# Patient Record
Sex: Female | Born: 1945
Health system: Southern US, Community
[De-identification: ages and names within clinical notes are randomized; demographics above are authoritative.]

## PROBLEM LIST (undated history)

## (undated) DIAGNOSIS — Z862 Personal history of diseases of the blood and blood-forming organs and certain disorders involving the immune mechanism: Secondary | ICD-10-CM

## (undated) DIAGNOSIS — K219 Gastro-esophageal reflux disease without esophagitis: Secondary | ICD-10-CM

## (undated) DIAGNOSIS — E119 Type 2 diabetes mellitus without complications: Secondary | ICD-10-CM

## (undated) DIAGNOSIS — Z853 Personal history of malignant neoplasm of breast: Secondary | ICD-10-CM

## (undated) DIAGNOSIS — I2510 Atherosclerotic heart disease of native coronary artery without angina pectoris: Secondary | ICD-10-CM

## (undated) DIAGNOSIS — I10 Essential (primary) hypertension: Secondary | ICD-10-CM

## (undated) DIAGNOSIS — R51 Headache: Secondary | ICD-10-CM

## (undated) DIAGNOSIS — M1990 Unspecified osteoarthritis, unspecified site: Secondary | ICD-10-CM

## (undated) HISTORY — DX: Type 2 diabetes mellitus without complications: E11.9

## (undated) HISTORY — DX: Atherosclerotic heart disease of native coronary artery without angina pectoris: I25.10

## (undated) HISTORY — PX: MASTECTOMY: SHX3

## (undated) HISTORY — DX: Unspecified osteoarthritis, unspecified site: M19.90

## (undated) HISTORY — DX: Personal history of malignant neoplasm of breast: Z85.3

## (undated) HISTORY — PX: CHOLECYSTECTOMY: SHX55

## (undated) HISTORY — PX: JOINT REPLACEMENT: SHX530

## (undated) HISTORY — PX: TONSILLECTOMY: SUR1361

## (undated) HISTORY — PX: ABDOMINAL HYSTERECTOMY: SHX81

---

## 1998-01-31 ENCOUNTER — Ambulatory Visit (HOSPITAL_BASED_OUTPATIENT_CLINIC_OR_DEPARTMENT_OTHER): Admission: RE | Admit: 1998-01-31 | Discharge: 1998-01-31 | Payer: Self-pay | Admitting: Surgery

## 1998-02-11 ENCOUNTER — Inpatient Hospital Stay (HOSPITAL_COMMUNITY): Admission: RE | Admit: 1998-02-11 | Discharge: 1998-02-12 | Payer: Self-pay | Admitting: Surgery

## 1998-03-11 ENCOUNTER — Encounter: Admission: RE | Admit: 1998-03-11 | Discharge: 1998-06-09 | Payer: Self-pay | Admitting: Surgery

## 1998-03-20 ENCOUNTER — Ambulatory Visit (HOSPITAL_COMMUNITY): Admission: RE | Admit: 1998-03-20 | Discharge: 1998-03-20 | Payer: Self-pay | Admitting: Emergency Medicine

## 1998-09-08 ENCOUNTER — Ambulatory Visit (HOSPITAL_COMMUNITY): Admission: RE | Admit: 1998-09-08 | Discharge: 1998-09-08 | Payer: Self-pay | Admitting: Emergency Medicine

## 2000-08-23 ENCOUNTER — Ambulatory Visit (HOSPITAL_COMMUNITY): Admission: RE | Admit: 2000-08-23 | Discharge: 2000-08-23 | Payer: Self-pay | Admitting: Gastroenterology

## 2000-08-23 ENCOUNTER — Encounter (INDEPENDENT_AMBULATORY_CARE_PROVIDER_SITE_OTHER): Payer: Self-pay | Admitting: *Deleted

## 2001-03-20 ENCOUNTER — Ambulatory Visit (HOSPITAL_COMMUNITY): Admission: RE | Admit: 2001-03-20 | Discharge: 2001-03-20 | Payer: Self-pay | Admitting: Family Medicine

## 2001-07-23 ENCOUNTER — Ambulatory Visit (HOSPITAL_COMMUNITY): Admission: RE | Admit: 2001-07-23 | Discharge: 2001-07-23 | Payer: Self-pay | Admitting: Emergency Medicine

## 2001-07-26 ENCOUNTER — Ambulatory Visit (HOSPITAL_COMMUNITY): Admission: RE | Admit: 2001-07-26 | Discharge: 2001-07-26 | Payer: Self-pay | Admitting: Emergency Medicine

## 2001-07-26 ENCOUNTER — Encounter: Payer: Self-pay | Admitting: Emergency Medicine

## 2002-01-01 ENCOUNTER — Other Ambulatory Visit: Admission: RE | Admit: 2002-01-01 | Discharge: 2002-01-01 | Payer: Self-pay | Admitting: Dermatology

## 2002-04-16 ENCOUNTER — Encounter: Admission: RE | Admit: 2002-04-16 | Discharge: 2002-07-15 | Payer: Self-pay | Admitting: Family Medicine

## 2002-05-21 ENCOUNTER — Encounter: Admission: RE | Admit: 2002-05-21 | Discharge: 2002-05-21 | Payer: Self-pay | Admitting: Oncology

## 2002-05-21 ENCOUNTER — Encounter (HOSPITAL_COMMUNITY): Admission: RE | Admit: 2002-05-21 | Discharge: 2002-06-20 | Payer: Self-pay | Admitting: Oncology

## 2002-05-22 ENCOUNTER — Encounter (HOSPITAL_COMMUNITY): Payer: Self-pay | Admitting: Oncology

## 2002-05-22 ENCOUNTER — Ambulatory Visit (HOSPITAL_COMMUNITY): Admission: RE | Admit: 2002-05-22 | Discharge: 2002-05-22 | Payer: Self-pay | Admitting: Oncology

## 2002-06-26 ENCOUNTER — Ambulatory Visit (HOSPITAL_COMMUNITY): Admission: RE | Admit: 2002-06-26 | Discharge: 2002-06-26 | Payer: Self-pay | Admitting: Internal Medicine

## 2003-01-14 ENCOUNTER — Encounter: Admission: RE | Admit: 2003-01-14 | Discharge: 2003-01-14 | Payer: Self-pay | Admitting: Oncology

## 2003-02-14 ENCOUNTER — Ambulatory Visit (HOSPITAL_COMMUNITY): Admission: RE | Admit: 2003-02-14 | Discharge: 2003-02-14 | Payer: Self-pay | Admitting: Pulmonary Disease

## 2003-12-03 ENCOUNTER — Ambulatory Visit (HOSPITAL_COMMUNITY): Admission: RE | Admit: 2003-12-03 | Discharge: 2003-12-03 | Payer: Self-pay | Admitting: Orthopaedic Surgery

## 2004-03-19 ENCOUNTER — Ambulatory Visit (HOSPITAL_COMMUNITY): Admission: RE | Admit: 2004-03-19 | Discharge: 2004-03-19 | Payer: Self-pay | Admitting: Obstetrics and Gynecology

## 2004-11-29 ENCOUNTER — Ambulatory Visit (HOSPITAL_COMMUNITY): Admission: RE | Admit: 2004-11-29 | Discharge: 2004-11-29 | Payer: Self-pay | Admitting: Emergency Medicine

## 2005-04-22 ENCOUNTER — Ambulatory Visit (HOSPITAL_COMMUNITY): Admission: RE | Admit: 2005-04-22 | Discharge: 2005-04-22 | Payer: Self-pay | Admitting: Orthopedic Surgery

## 2005-08-09 ENCOUNTER — Inpatient Hospital Stay (HOSPITAL_COMMUNITY): Admission: RE | Admit: 2005-08-09 | Discharge: 2005-08-12 | Payer: Self-pay | Admitting: Orthopedic Surgery

## 2005-08-13 ENCOUNTER — Encounter (HOSPITAL_COMMUNITY): Admission: RE | Admit: 2005-08-13 | Discharge: 2005-09-12 | Payer: Self-pay | Admitting: Oncology

## 2005-08-13 ENCOUNTER — Encounter: Admission: RE | Admit: 2005-08-13 | Discharge: 2005-08-13 | Payer: Self-pay | Admitting: Oncology

## 2005-08-13 ENCOUNTER — Ambulatory Visit (HOSPITAL_COMMUNITY): Payer: Self-pay | Admitting: Oncology

## 2005-08-31 ENCOUNTER — Encounter (HOSPITAL_COMMUNITY): Admission: RE | Admit: 2005-08-31 | Discharge: 2005-09-22 | Payer: Self-pay | Admitting: Orthopedic Surgery

## 2005-09-29 ENCOUNTER — Encounter (HOSPITAL_COMMUNITY): Admission: RE | Admit: 2005-09-29 | Discharge: 2005-10-13 | Payer: Self-pay | Admitting: Orthopedic Surgery

## 2005-11-04 ENCOUNTER — Ambulatory Visit (HOSPITAL_COMMUNITY): Admission: RE | Admit: 2005-11-04 | Discharge: 2005-11-04 | Payer: Self-pay | Admitting: Emergency Medicine

## 2005-12-20 ENCOUNTER — Other Ambulatory Visit: Admission: RE | Admit: 2005-12-20 | Discharge: 2005-12-20 | Payer: Self-pay | Admitting: General Surgery

## 2005-12-20 ENCOUNTER — Encounter (INDEPENDENT_AMBULATORY_CARE_PROVIDER_SITE_OTHER): Payer: Self-pay | Admitting: *Deleted

## 2006-02-10 ENCOUNTER — Ambulatory Visit (HOSPITAL_COMMUNITY): Admission: RE | Admit: 2006-02-10 | Discharge: 2006-02-10 | Payer: Self-pay | Admitting: Orthopedic Surgery

## 2009-05-16 ENCOUNTER — Ambulatory Visit (HOSPITAL_COMMUNITY): Admission: RE | Admit: 2009-05-16 | Discharge: 2009-05-16 | Payer: Self-pay | Admitting: Family Medicine

## 2011-02-12 NOTE — Op Note (Signed)
NAME:  Brenda Yoder                          ACCOUNT NO.:  [id number]   MEDICAL RECORD NO.:  [id number]                   PATIENT TYPE:  AMB   LOCATION:  DAY                                  FACILITY:  [facility]   PHYSICIAN:  Tilda Burrow, M.D.              DATE OF BIRTH:  [birth year]   DATE OF PROCEDURE:  DATE OF DISCHARGE:                                 OPERATIVE REPORT   PREOPERATIVE DIAGNOSIS:  Left ovarian cyst, history of breast cancer, for  prophylactic removal of ovaries for ovarian cancer risk reduction.   POSTOPERATIVE DIAGNOSES:  1. Left ovarian cyst, history of breast cancer, for prophylactic removal of     ovaries for ovarian cancer risk reduction.  2. Right ovarian enlargement.  3. Umbilical hernia.   PROCEDURE:  1. Laparoscopic bilateral salpingo-oophorectomy.  2. Umbilical herniorrhaphy.   SURGEON:  Tilda Burrow, M.D.   ASSISTANT:  __________   ANESTHESIA:  General   COMPLICATIONS:  None.   FINDINGS:  Incarcerated omental fat and umbilical hernia, enlarged right  ovary with firm ovarian tissue without cystic component, adherent left ovary  easily removed.   DETAILS OF PROCEDURE:  The patient was taken to the operating room and  prepped and draped for combined abdominal and vaginal procedure.  A Foley  catheter was in place and sponge stick placed per vagina after abdominal and  vaginal prepping.  We then proceeded with Veress needle placement through  the umbilicus with orientation of the needle towards the pelvis with  pneumoperitoneum easily achieved at 3 liters CO2 under 11 mmHg pressure.  The laparoscopic trocar was introduced under direct visualization and we  identified that we were in omental tissue, but could see an easy window to  allow exposure and visibility of the abdomen.   I then placed a suprapubic 11-mm trocar under direct visualization and  inspected the pelvis with the patient in Trendelenburg position.  The camera  was rotated to  the suprapubic site and we visualized that she had omental  attachments in the umbilical hernia and we had simply gone through some of  the omental tissue.   I then proceed with identification of pelvic structures starting with the  left side where the very small left ovary was attached to the upper aspects  of the right side of the vaginal cuff.  The tube was small and we were able  to cut free the tube on that side.  We used the Gyrus bipolar cautery device  when adequate visualization allowed [place] too, we specifically removed the  attachments of the ovary to the left side of the vaginal cuff and left side  wall by sharp dissection, which allowed [place] to mobilize the ovary  sufficiently.  Then that transection across the infundibulopelvic ligament  could be easily done.  We were well out of the way of the ureter.  The rest  of the attachments  to the ovary to the side wall was sharply dissected off  with laparoscopic  Metzenbaum's.  Specimen was placed in the cul-de-sac.   We then moved to the right side where a much larger, and mobile ovary was  found.  The ureter could be visualized bilaterally to the surgical field.  The Gyrus bipolar cautery and cutting technique was used to cross-clamp  across the infundibulopelvic ligament and then peel the ovary off of the  side wall.  The result was very hemostatic and the specimens were then  placed in an EndoCatch bag through the suprapubic site.  NOTE:  During the  course of the procedure it became necessary to have additional  manipulating/grasping technique; and so a third port, 5-mm in diameter, was  placed under direct visualization in the right lower quadrant.   At this point we addressed the umbilical hernia.  The umbilical port was  taken out and digital pressure placed down on the umbilical hernia and we  were able to invert the sac and peel the omental fat off of its  infraumbilical attachments.   It was then apparent that we needed to  remove the specimen and the large 4-  cm ovary was removed through the umbilical site by expanding the fascial  incision transversely with sharp dissection and easily pulling the trocar  and bag through the umbilical site.  By this time we had already irrigated  the abdominal cavity and deflated the abdomen.  We removed the remaining  laparoscopic equipment.   Once the bag was removed, we closed the umbilical site by a series of  interrupted sutures of #0 Dexon closing the fascia which could be easily  visualized.  We then closed the fascia in the suprapubic site and then  closed all 3 skin sites.  The subcuticular sutures were placed followed by  staple closure of the skin. The patient tolerated the procedure well.      ___________________________________________                                            Tilda Burrow, M.D.   JVF/MEDQ  D:  03/20/2004  T:  03/20/2004  Job:  [id number]

## 2011-02-12 NOTE — H&P (Signed)
NAME:  Brenda Yoder, Brenda Yoder                          ACCOUNT NO.:  [id number]   MEDICAL RECORD NO.:  [id number]                   PATIENT TYPE:  AMB   LOCATION:  DAY                                  FACILITY:  [facility]   PHYSICIAN:  Tilda Burrow, M.D.              DATE OF BIRTH:  1946-04-28   DATE OF ADMISSION:  03/18/2004  DATE OF DISCHARGE:                                HISTORY & PHYSICAL   ADMISSION DIAGNOSES:  1. Left ovarian cyst.  2. History of breast cancer with prophylactic removal of ovaries for ovarian     cancer risk reduction.   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  She is well aware of the associated increased risks of ovarian  cancer and familial breast cancer, and she has talked with GYN oncologist,  Dr. Gildardo Griffes in the past and has come to the decision of prophylactic  bilateral salpingo-oophorectomy.  Hysterectomy has been performed in the  past.  She has investigated this process extensively, received reference  articles including contemporary OB-GYN reference articles and has decided to  proceed with laparoscopic bilateral salpingo-oophorectomy.  Brenda Yoder has  increased since pelvic ultrasound December 11, 2003, which was done for  prophylactic monitoring of ovaries and revealed a relatively obscured left  ovary but a large 3.8 cm right ovary. CA125  was quite reassuring 5.2 on a  scale of 0 to 30.  She desires to proceed.  She has had a bowel prep,  counseling including the risk of injury to adjacent organs and the potential  need for laparotomy to complete the procedure.  Plans are to proceed with  prophylactic bilateral salpingo-oophorectomy on March 19, 2004.   PAST MEDICAL HISTORY:  1. Hypertension.  2. Breast cancer treated, estrogen receptor positive, treated with  tamoxifen     for five years.  3. Status post hysterectomy.   PAST SURGICAL HISTORY:  1. Hysterectomy in 1982.  2. Cholecystectomy in 1994.  3. Bilateral mastectomy in 1999.  4. Knee surgery in 1984.   ALLERGIES:  None known.   MEDICATIONS:  Celebrex, Zestoretic, Zovirax, Imitrex, Ambien.   PHYSICAL EXAMINATION:  VITAL SIGNS: Height 5 feet 3 inches, weight 198.  GENERAL:  She is a healthy-appearing Caucasian female, alert and oriented x  3.  HEENT:  Pupils equal and reactive.  Extraocular movements intact.  NECK:  Supple. Trachea midline.  CARDIOVASCULAR:  Exam unremarkable.  ABDOMEN:  Moderate abdominal thickness.  CHEST:  Normal for status post prophylactic mastectomy.  PELVIC: External genitalia normal.  Cervix and uterus absent.  Adnexa  nontender with a  3.1 x 4.0 cm right ovary and smaller left ovary.   PLAN:  Laparoscopic bilateral salpingo-oophorectomy March 19, 2004.  ___________________________________________                                         Tilda Burrow, M.D.   JVF/MEDQ  D:  03/18/2004  T:  03/18/2004  Job:  [id number]

## 2011-02-12 NOTE — Procedures (Signed)
[place]  Patient:    Brenda Yoder, Brenda Yoder                       MRN: [id number] Proc. Date: 08/23/00 Adm. Date:  [id number] Attending:  Rich Brave CC:         Talmadge Coventry, M.D.   Procedure Report  PROCEDURE:  Colonoscopy with hot biopsy and polypectomy.  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  FINDINGS:  Large sigmoid polyp removed.  Several tiny rectosigmoid polyps removed.  DESCRIPTION OF PROCEDURE:  The nature, purpose, and risks of the procedure were familiar to the patient, who provided written consent.  Sedation prior to and during the course of the procedure totalled fentanyl 70 mcg and Versed 8 mg IV without arrhythmias or desaturation.  The Olympus pediatric adjustable-tension video colonoscope was advanced through a slightly angulated sigmoid region and then quite easily to the cecum and, in fact, into the terminal ileum, which had a normal appearance, after which pullback was performed.  The patient had several polyps in the rectosigmoid region.  The largest was a 1.5-2 cm pedunculated polyp removed by snare technique, with the majority of the polyp removed in one piece but a small amount right at the base being trimmed up by a couple of additional snares.  There were several tiny sessile polyps, hyperplastic in appearance, which were removed by hot or cold biopsy technique in the rectosigmoid region.  There was no evidence of cancer, colitis, vascular malformations, or diverticulosis.  Retroflexion was not performed in the rectum due to the proximity of some biopsies.  It might be noted that the stalk of the largest polyp was injected with 0.5 cc of 1:10,000 epinephrine with a good bleb and blanching to help prevent bleeding.  There was no bleeding of any significance with any of the polypectomies  nor any evidence of excessive cautery.  The patient tolerated the procedure well, and there were no apparent complications.  IMPRESSION:  Rectosigmoid polyps, removed as described above.  PLAN:  Await pathology.  Anticipate flexible sigmoidoscopy in one year and follow-up colonoscopy in three years to recheck this area. DD:  08/23/00 TD:  08/23/00 Job: [id number]

## 2011-02-12 NOTE — Discharge Summary (Signed)
Brenda Yoder, Brenda Yoder                ACCOUNT NO.:  [id number]   MEDICAL RECORD NO.:  [id number]          PATIENT TYPE:  INP   LOCATION:  5016                         FACILITY:  [facility]   PHYSICIAN:  Elana Alm. Thurston Yoder, M.D. DATE OF BIRTH:  1945-11-29   DATE OF ADMISSION:  08/09/2005  DATE OF DISCHARGE:  08/12/2005                                 DISCHARGE SUMMARY   ADMISSION DIAGNOSES:  1.  End-stage degenerative joint disease, left knee.  2.  Hypertension.  3.  High cholesterol.  4.  Migraines.   DISCHARGE DIAGNOSES:  1.  End-stage degenerative joint disease, left knee.  2.  Hypertension.  3.  High cholesterol.  4.  Migraines.  5.  Immune thrombocytopenia purpura.   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  She has failed conservative  care, including interarticular cortisone injections and anti-inflammatories.  She understands the risks, benefits, and possible complications of a left  total knee replacement and is without question.   PROCEDURE:  On August 09, 2005, the patient underwent a left total knee  replacement by Dr. Thurston Yoder and a left femoral nerve block by anesthesia.  She  tolerated the procedures well and was admitted postoperatively for pain  control, DVT prophylaxis, and physical therapy.   HOSPITAL COURSE:  On postoperative day #1, the patient was doing well.  Her  hemoglobin was 10.4.  Her platelets were 125.  Her sodium was 133.  Her  surgical wound was well-approximated.  Her dressing was changed.  CBGs were  ordered q.a.c. and q.h.s.  She was started on Norco 10/325 one to two q.4h.  p.r.n. pain.   On postoperative day #2, the patient was much improved, being off the PCA  pump, T max of 100, T now was 98.9.  Her INR was 1.1.  Her sodium was 128.  Her platelet count was 117.  She was given a suppository.  Her dressing was  changed.   On postoperative day #3, her hemoglobin was  7.9.  Her INR was 1.2.  Her  platelet count was 125.  Her sodium was up to 131, her potassium was 3.2.  Her blood sugars were 161.  She was discharged to home in stable condition.  Long discussion about her low hemoglobin.  She would prefer not to be  transfused, so we will continue to watch her.  She will have her hemoglobin  drawn again tomorrow.  If her hemoglobin is above 8, she will not need a  transfusion.  If it continues to remain below 7, she will need a  transfusion.   CONDITION ON DISCHARGE:  She was discharged to home in stable condition,  weightbearing as tolerated.   DISCHARGE MEDICATIONS:  1.  Norco 5/325 one to two q.4h. p.r.n. pain.  2.  Arixtra 2.5 mg daily.  3.  Nu-Iron 150 mg twice a day.  4.  Lisinopril daily.  5.  Lipitor daily.  6.  Ambien daily.  7.  Celebrex daily.  8.  Zovirax as needed.  9.  Imitrex as needed.  10. Zestril daily.   FOLLOW UP:  She will follow up in Dr. Sherene Sires office on August 23, 2005.   DISCHARGE INSTRUCTIONS:  She will call with increased pain, increased  redness, increased drainage, or a temperature greater than 101.      Kirstin Shepperson, P.A.      Brenda Yoder, M.D.  Electronically Signed    KS/MEDQ  D:  10/03/2005  T:  10/04/2005  Job:  [id number]

## 2011-02-12 NOTE — Op Note (Signed)
NAMEBRYLEE, BERK                ACCOUNT NO.:  [id number]   MEDICAL RECORD NO.:  [id number]          PATIENT TYPE:  INP   LOCATION:  5016                         FACILITY:  [facility]   PHYSICIAN:  Elana Alm. Thurston Hole, M.D. DATE OF BIRTH:  [birth year]   DATE OF PROCEDURE:  08/09/2005  DATE OF DISCHARGE:                                 OPERATIVE REPORT   PREOPERATIVE DIAGNOSIS:  Left knee degenerative joint disease.   POSTOPERATIVE DIAGNOSIS:  Left knee degenerative joint disease.   OPERATION PERFORMED:  1.  Left total knee replacement using Depuy cemented total knee system with      a #2.5 cemented femur, #2.5 cemented tibia with 10 mm polyethylene RP      tibial spacer and 32 mm polyethylene cemented patella.  2.  Left total knee computer assisted navigation.   SURGEON:  Elana Alm. Thurston Hole, M.D.   ASSISTANT:  Julien Girt, P.A.   ANESTHESIA:  General.   OPERATIVE TIME:  One hour and 45 minutes.   COMPLICATIONS:  None.   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  She was placed on the operating table in supine  position.  She received Ancef 1 g IV preoperatively for prophylaxis.  Her  left knee was examined and showed range of motion from -10 to 125 degrees  with moderate varus deformity, knee stable to ligamentous exam with normal  patellar tracking. She had a Foley catheter placed under sterile conditions.  Her left leg and foot was then prepped using sterile DuraPrep and draped  using sterile technique.  The leg was exsanguinated and a thigh tourniquet  elevated to 375 mm.  Initially through a 15 cm longitudinal incision based  over the patella initial exposure was made.  The underlying subcutaneous  tissues were incised in line with the skin incision.  A median arthrotomy  was performed revealing an excessive amount of normal-appearing joint  fluid.  The articular surfaces were inspected.  She had grade 4 changes medially,  grade 3 and 4 changes laterally and grade 3 and 4 changes in the  patellofemoral joint.  The medial and lateral meniscal remnants were removed  as well as the anterior cruciate ligament remnants.  Osteophytes were  removed from the femoral condyles and tibial plateau.  At this point then  two pins were placed through two small incisions in the proximal tibia and  two pins placed in the distal femoral shaft through the proximal aspect of  the total knee incision for placement and activation of the navigation  system.  The navigation system was activated and deformities were measured.  Initial deformities showed flexion contracture of 12 degrees and varus  deformity of 8 degrees.  At this point then the distal femoral cut was made  using the computer navigation system resecting 12 mm off the distal femur  with an exact and perfect cut.  The distal femur was then sized.  A 2.5 was  found to  be the appropriate size.  Again, using the navigation system, the  2.5 cutting jig was placed and then these cuts were made.  After this was  done, the proximal tibia was exposed.  Using again the computer navigation  system, the proximal tibial guide was placed resecting 7.5 mm off the medial  or lower side, 10 mm off the higher or lateral side and then this proximal  tibial cut was made with 0 degree slope, again being confirmed by the  navigation system.  After this was done then flexion and extension gaps were  tested with a 10 mm spacer.  This was found to give excellent balance and  excellent correction of her flexion and varus deformities.  At this point  then the proximal tibial base plate 2.5 was placed and a keel cut was made.  The distal femoral PCL block cutter was placed and then these cuts were  made.  At this point then the #2.5 femoral trial was placed and with the 2.5  tibial base plate trial being placed and  the 10 mm polyethylene spacer, the  knee was taken through a range of motion, found to have excellent correction  of her flexion and varus deformities, range of motion from 0 to 125 degrees  with excellent stability.  Patella tracking was also found to be normal.  At  this point the patella was sized and an 8 mm resurfacing cut was made and  three locking holes were placed for a 32 mm patellar button which was  placed.  Again patellofemoral tracking was evaluated and this was found to  be normal.  At this point it was felt that all of the components were of  excellent size, fit and stability with excellent correction of her  deformities.  The navigation system was then deactivated and the pins were  removed.  At this point then the trial components were removed, the knee was  then jet lavage irrigated with 3 L of saline solution.  The proximal tibia  was exposed and the 2.5 mm tibial base plate with cement backing was  hammered into position with an excellent fit with excess cement being  removed from around the edges.  The 2.5 femoral component with cement  backing was hammered into position also with an excellent fit with excess  cement being removed from around the edges.  The 10 mm polyethylene RP  tibial spacer was then placed on the tibial base plate, the knee reduced,  taken through a range of motion 0 to 125 degrees with excellent stability,  excellent correction of her flexion and varus deformities, the 32 mm  polyethylene cement patella was placed in its position and held there with a  clamp.  After this was done, no further pathology was noted.  Excellent  tracking noted as well.  At this point the wound was further irrigated with  saline.  The tourniquet was released. Hemostasis obtained with cautery.  The  arthrotomy was then closed with #1 Ethibond suture over two medium Hemovac  drains.  Subcutaneous tissues closed with 0 and 2-0 Vicryl.  Subcuticular layer closed with 4-0  Monocryl.  Steri-Strips were applied.  Sterile  dressings and a long leg splint applied.  Hemovac injected with 0.25%  Marcaine with epinephrine and 4 mg of morphine and then the patient  awakened, extubated and taken to recovery room in stable condition.  Needle  and sponge counts correct times two at the end of the case.  Robert A. Thurston Hole, M.D.  Electronically Signed     RAW/MEDQ  D:  08/09/2005  T:  08/10/2005  Job:  [id number]

## 2011-07-09 ENCOUNTER — Other Ambulatory Visit (HOSPITAL_COMMUNITY): Payer: Self-pay | Admitting: Internal Medicine

## 2011-07-09 ENCOUNTER — Ambulatory Visit (HOSPITAL_COMMUNITY)
Admission: RE | Admit: 2011-07-09 | Discharge: 2011-07-09 | Disposition: A | Discharge status: Home / Self Care | Payer: PRIVATE HEALTH INSURANCE | Source: Ambulatory Visit | Attending: Internal Medicine | Admitting: Internal Medicine

## 2011-07-09 DIAGNOSIS — M25552 Pain in left hip: Secondary | ICD-10-CM

## 2011-07-09 DIAGNOSIS — M25559 Pain in unspecified hip: Secondary | ICD-10-CM | POA: Insufficient documentation

## 2012-02-10 ENCOUNTER — Other Ambulatory Visit (HOSPITAL_COMMUNITY): Payer: Self-pay | Admitting: Orthopedic Surgery

## 2012-02-10 DIAGNOSIS — IMO0002 Reserved for concepts with insufficient information to code with codable children: Secondary | ICD-10-CM

## 2012-02-11 ENCOUNTER — Other Ambulatory Visit (HOSPITAL_COMMUNITY): Payer: Self-pay | Admitting: Orthopedic Surgery

## 2012-02-11 ENCOUNTER — Ambulatory Visit (HOSPITAL_COMMUNITY)
Admission: RE | Admit: 2012-02-11 | Discharge: 2012-02-11 | Disposition: A | Discharge status: Home / Self Care | Payer: PRIVATE HEALTH INSURANCE | Source: Ambulatory Visit | Attending: Orthopedic Surgery | Admitting: Orthopedic Surgery

## 2012-02-11 DIAGNOSIS — IMO0002 Reserved for concepts with insufficient information to code with codable children: Secondary | ICD-10-CM

## 2012-02-11 DIAGNOSIS — D1779 Benign lipomatous neoplasm of other sites: Secondary | ICD-10-CM | POA: Insufficient documentation

## 2012-02-11 DIAGNOSIS — R229 Localized swelling, mass and lump, unspecified: Secondary | ICD-10-CM | POA: Insufficient documentation

## 2012-02-11 LAB — BASIC METABOLIC PANEL
CO2: 30 mEq/L (ref 19–32)
Chloride: 97 mEq/L (ref 96–112)
GFR calc non Af Amer: 87 mL/min — ABNORMAL LOW (ref >90)
Glucose, Bld: 133 mg/dL — ABNORMAL HIGH (ref 70–99)
Potassium: 4.1 mEq/L (ref 3.5–5.1)

## 2012-02-18 ENCOUNTER — Encounter (HOSPITAL_BASED_OUTPATIENT_CLINIC_OR_DEPARTMENT_OTHER): Payer: Self-pay | Admitting: *Deleted

## 2012-02-22 ENCOUNTER — Other Ambulatory Visit: Payer: Self-pay | Admitting: Orthopedic Surgery

## 2012-02-23 ENCOUNTER — Encounter (HOSPITAL_BASED_OUTPATIENT_CLINIC_OR_DEPARTMENT_OTHER): Payer: Self-pay | Admitting: Anesthesiology

## 2012-02-23 ENCOUNTER — Ambulatory Visit (HOSPITAL_BASED_OUTPATIENT_CLINIC_OR_DEPARTMENT_OTHER): Payer: PRIVATE HEALTH INSURANCE | Admitting: Anesthesiology

## 2012-02-23 ENCOUNTER — Encounter (HOSPITAL_BASED_OUTPATIENT_CLINIC_OR_DEPARTMENT_OTHER): Payer: Self-pay | Admitting: Orthopedic Surgery

## 2012-02-23 ENCOUNTER — Other Ambulatory Visit: Payer: Self-pay

## 2012-02-23 ENCOUNTER — Ambulatory Visit (HOSPITAL_BASED_OUTPATIENT_CLINIC_OR_DEPARTMENT_OTHER)
Admission: RE | Admit: 2012-02-23 | Discharge: 2012-02-23 | Disposition: A | Discharge status: Home / Self Care | Payer: PRIVATE HEALTH INSURANCE | Source: Ambulatory Visit | Attending: Orthopedic Surgery | Admitting: Orthopedic Surgery

## 2012-02-23 ENCOUNTER — Encounter (HOSPITAL_BASED_OUTPATIENT_CLINIC_OR_DEPARTMENT_OTHER)
Admission: RE | Disposition: A | Discharge status: Home / Self Care | Payer: Self-pay | Source: Ambulatory Visit | Attending: Orthopedic Surgery

## 2012-02-23 DIAGNOSIS — M799 Soft tissue disorder, unspecified: Secondary | ICD-10-CM | POA: Insufficient documentation

## 2012-02-23 DIAGNOSIS — K219 Gastro-esophageal reflux disease without esophagitis: Secondary | ICD-10-CM | POA: Insufficient documentation

## 2012-02-23 DIAGNOSIS — I10 Essential (primary) hypertension: Secondary | ICD-10-CM | POA: Insufficient documentation

## 2012-02-23 DIAGNOSIS — E119 Type 2 diabetes mellitus without complications: Secondary | ICD-10-CM | POA: Insufficient documentation

## 2012-02-23 HISTORY — PX: MASS EXCISION: SHX2000

## 2012-02-23 HISTORY — DX: Gastro-esophageal reflux disease without esophagitis: K21.9

## 2012-02-23 HISTORY — DX: Essential (primary) hypertension: I10

## 2012-02-23 HISTORY — DX: Headache: R51

## 2012-02-23 LAB — GLUCOSE, CAPILLARY: Glucose-Capillary: 127 mg/dL — ABNORMAL HIGH (ref 70–99)

## 2012-02-23 SURGERY — EXCISION MASS
Anesthesia: General | Site: Arm Lower | Laterality: Right | Wound class: Clean

## 2012-02-23 MED ORDER — LORAZEPAM 2 MG/ML IJ SOLN: 1.0000 mg | Freq: Once | INTRAMUSCULAR | Status: DC | PRN

## 2012-02-23 MED ORDER — BUPIVACAINE HCL (PF) 0.25 % IJ SOLN
INTRAMUSCULAR | Status: DC | PRN
  Administered 2012-02-23: 7 mL

## 2012-02-23 MED ORDER — CEFAZOLIN SODIUM 1-5 GM-% IV SOLN
INTRAVENOUS | Status: DC | PRN
  Administered 2012-02-23: 2 g via INTRAVENOUS

## 2012-02-23 MED ORDER — DEXAMETHASONE SODIUM PHOSPHATE 4 MG/ML IJ SOLN
INTRAMUSCULAR | Status: DC | PRN
  Administered 2012-02-23: 10 mg via INTRAVENOUS

## 2012-02-23 MED ORDER — MIDAZOLAM HCL 5 MG/5ML IJ SOLN
INTRAMUSCULAR | Status: DC | PRN
  Administered 2012-02-23: 2 mg via INTRAVENOUS

## 2012-02-23 MED ORDER — SCOPOLAMINE 1 MG/3DAYS TD PT72
1.0000 | MEDICATED_PATCH | Freq: Once | TRANSDERMAL | Status: DC
  Administered 2012-02-23: 1.5 mg via TRANSDERMAL

## 2012-02-23 MED ORDER — CHLORHEXIDINE GLUCONATE 4 % EX LIQD: 60.0000 mL | Freq: Once | CUTANEOUS | Status: DC

## 2012-02-23 MED ORDER — LACTATED RINGERS IV SOLN
INTRAVENOUS | Status: DC | PRN
  Administered 2012-02-23 (×2): via INTRAVENOUS

## 2012-02-23 MED ORDER — ONDANSETRON HCL 4 MG/2ML IJ SOLN
INTRAMUSCULAR | Status: DC | PRN
  Administered 2012-02-23: 4 mg via INTRAVENOUS

## 2012-02-23 MED ORDER — FENTANYL CITRATE 0.05 MG/ML IJ SOLN
INTRAMUSCULAR | Status: DC | PRN
  Administered 2012-02-23 (×2): 50 ug via INTRAVENOUS

## 2012-02-23 MED ORDER — HYDROMORPHONE HCL PF 1 MG/ML IJ SOLN: 0.2500 mg | INTRAMUSCULAR | Status: DC | PRN

## 2012-02-23 MED ORDER — HYDROCODONE-ACETAMINOPHEN 5-500 MG PO TABS: 1.0000 | ORAL_TABLET | ORAL | Status: AC | PRN

## 2012-02-23 MED ORDER — HYDROCODONE-ACETAMINOPHEN 5-325 MG PO TABS
1.0000 | ORAL_TABLET | Freq: Once | ORAL | Status: AC | PRN
  Administered 2012-02-23: 1 via ORAL

## 2012-02-23 MED ORDER — MIDAZOLAM HCL 2 MG/2ML IJ SOLN: 1.0000 mg | INTRAMUSCULAR | Status: DC | PRN

## 2012-02-23 MED ORDER — LACTATED RINGERS IV SOLN
INTRAVENOUS | Status: DC
  Administered 2012-02-23: 13:00:00 via INTRAVENOUS

## 2012-02-23 MED ORDER — LIDOCAINE HCL (CARDIAC) 20 MG/ML IV SOLN
INTRAVENOUS | Status: DC | PRN
  Administered 2012-02-23 (×2): 50 mg via INTRAVENOUS

## 2012-02-23 MED ORDER — PROPOFOL 10 MG/ML IV EMUL
INTRAVENOUS | Status: DC | PRN
  Administered 2012-02-23: 40 mg via INTRAVENOUS
  Administered 2012-02-23: 160 mg via INTRAVENOUS

## 2012-02-23 SURGICAL SUPPLY — 47 items
BANDAGE COBAN STERILE 2 (GAUZE/BANDAGES/DRESSINGS) IMPLANT
BANDAGE GAUZE ELAST BULKY 4 IN (GAUZE/BANDAGES/DRESSINGS) ×1 IMPLANT
BLADE MINI RND TIP GREEN BEAV (BLADE) IMPLANT
BLADE SURG 15 STRL LF DISP TIS (BLADE) ×1 IMPLANT
BLADE SURG 15 STRL SS (BLADE) ×2
BNDG CMPR 9X4 STRL LF SNTH (GAUZE/BANDAGES/DRESSINGS) ×1
BNDG COHESIVE 1X5 TAN STRL LF (GAUZE/BANDAGES/DRESSINGS) IMPLANT
BNDG COHESIVE 3X5 TAN STRL LF (GAUZE/BANDAGES/DRESSINGS) ×1 IMPLANT
BNDG ESMARK 4X9 LF (GAUZE/BANDAGES/DRESSINGS) ×1 IMPLANT
CHLORAPREP W/TINT 26ML (MISCELLANEOUS) ×2 IMPLANT
CLOTH BEACON ORANGE TIMEOUT ST (SAFETY) ×2 IMPLANT
CORDS BIPOLAR (ELECTRODE) ×2 IMPLANT
COVER MAYO STAND STRL (DRAPES) ×2 IMPLANT
COVER TABLE BACK 60X90 (DRAPES) ×2 IMPLANT
CUFF TOURNIQUET SINGLE 18IN (TOURNIQUET CUFF) ×1 IMPLANT
DECANTER SPIKE VIAL GLASS SM (MISCELLANEOUS) IMPLANT
DRAIN PENROSE 1/2X12 LTX STRL (WOUND CARE) IMPLANT
DRAPE EXTREMITY T 121X128X90 (DRAPE) ×2 IMPLANT
DRAPE SURG 17X23 STRL (DRAPES) ×2 IMPLANT
GAUZE XEROFORM 1X8 LF (GAUZE/BANDAGES/DRESSINGS) ×2 IMPLANT
GLOVE BIO SURGEON STRL SZ 6.5 (GLOVE) ×2 IMPLANT
GLOVE SURG ORTHO 8.0 STRL STRW (GLOVE) ×2 IMPLANT
GOWN BRE IMP PREV XXLGXLNG (GOWN DISPOSABLE) ×2 IMPLANT
GOWN PREVENTION PLUS XLARGE (GOWN DISPOSABLE) ×2 IMPLANT
NDL SAFETY ECLIPSE 18X1.5 (NEEDLE) ×1 IMPLANT
NEEDLE 27GAX1X1/2 (NEEDLE) ×1 IMPLANT
NEEDLE HYPO 18GX1.5 SHARP (NEEDLE) ×2
NS IRRIG 1000ML POUR BTL (IV SOLUTION) ×2 IMPLANT
PACK BASIN DAY SURGERY FS (CUSTOM PROCEDURE TRAY) ×2 IMPLANT
PAD CAST 3X4 CTTN HI CHSV (CAST SUPPLIES) IMPLANT
PADDING CAST ABS 3INX4YD NS (CAST SUPPLIES) ×1
PADDING CAST ABS 4INX4YD NS (CAST SUPPLIES)
PADDING CAST ABS COTTON 3X4 (CAST SUPPLIES) IMPLANT
PADDING CAST ABS COTTON 4X4 ST (CAST SUPPLIES) ×1 IMPLANT
PADDING CAST COTTON 3X4 STRL (CAST SUPPLIES)
SPLINT PLASTER CAST XFAST 3X15 (CAST SUPPLIES) IMPLANT
SPLINT PLASTER XTRA FASTSET 3X (CAST SUPPLIES)
SPONGE GAUZE 4X4 12PLY (GAUZE/BANDAGES/DRESSINGS) ×2 IMPLANT
STOCKINETTE 4X48 STRL (DRAPES) ×2 IMPLANT
SUT VIC AB 4-0 P2 18 (SUTURE) ×1 IMPLANT
SUT VICRYL RAPID 5 0 P 3 (SUTURE) IMPLANT
SUT VICRYL RAPIDE 4/0 PS 2 (SUTURE) ×2 IMPLANT
SYR BULB 3OZ (MISCELLANEOUS) ×2 IMPLANT
SYR CONTROL 10ML LL (SYRINGE) ×1 IMPLANT
TOWEL OR 17X24 6PK STRL BLUE (TOWEL DISPOSABLE) ×4 IMPLANT
UNDERPAD 30X30 INCONTINENT (UNDERPADS AND DIAPERS) ×2 IMPLANT
WATER STERILE IRR 1000ML POUR (IV SOLUTION) ×2 IMPLANT

## 2012-02-23 NOTE — Op Note (Signed)
NAMEHARPREET, SIGNORE NO.:  [id number]  MEDICAL RECORD NO.:  [id number]  LOCATION:                                 FACILITY:  PHYSICIAN:  Cindee Salt, M.D.       DATE OF BIRTH:  1946/01/05  DATE OF PROCEDURE:  02/23/2012 DATE OF DISCHARGE:                              OPERATIVE REPORT   PREOPERATIVE DIAGNOSIS:  Mass right forearm.  POSTOPERATIVE DIAGNOSIS:  Mass right forearm.  OPERATION:  Excisional biopsy mass, right forearm.  SURGEON:  Cindee Salt, M.D.  ASSISTANT:  None.  ANESTHESIA:  General with local infiltration.  ANESTHESIOLOGIST:  Bedelia Person, MD.  HISTORY:  The patient is a 66 year old female with a history of recurrent mass over the radial aspect of her right forearm.  She is desirous having this excised.  Pre, peri, and postoperative course had been discussed along with risks and complications.  She is aware that there is no guarantee with surgery; possibility of infection, recurrence; injury to arteries, nerves, tendons; incomplete relief of symptoms; dystrophy.  MRI revealed that she has a well circumscribed mass down to the radius.  She is seen in the preoperative area, the extremity marked by both the patient and surgeon.  Antibiotic given.  PROCEDURE:  The patient was brought to the operating room where general anesthetic was carried out without difficulty.  She was prepped using ChloraPrep, supine position, right arm free.  A 3-minute dry time was allowed.  Time-out taken confirming the patient and procedure.  A longitudinal incision was made over the mass through the old incision, carried down through subcutaneous tissue.  Bleeders were electrocauterized with bipolar.  The radial nerve was not identified in the wound proximally or distally.  This allowed the mass to be visualized dorsal to the brachioradialis at the junction of the outrigger muscles.  This was followed proximally and distally.  It was well circumscribed in the central  aspect, but went down to the periosteum.  This was dissected free of it, removing any fatty tissue noted in the area.  This was also proceeded proximally.  There was some displacement of the musculature proximally and a small portion muscle was taken to be certain that all of the fatty tissue was removed in an effort to be certain that it is was not a metaplastic condition with some extension.  The entire specimen was sent to Pathology.  The wound was copiously irrigated with saline.  The subcutaneous tissue was closed with interrupted 4-0 Vicryl and the skin with a subcuticular 4-0 Vicryl Rapide.  A sterile compressive dressing was applied.  On deflation of the tourniquet, all fingers immediately pinked.  She was taken to the recovery room.          ______________________________ Cindee Salt, M.D.     GK/MEDQ  D:  02/23/2012  T:  02/23/2012  Job:  [id number]

## 2012-02-23 NOTE — Anesthesia Postprocedure Evaluation (Signed)
  Anesthesia Post-op Note  Patient: Brenda Yoder  Procedure(s) Performed: Procedure(s) (LRB): EXCISION MASS (Right)  Patient Location: PACU  Anesthesia Type: General  Level of Consciousness: awake  Airway and Oxygen Therapy: Patient Spontanous Breathing  Post-op Pain: mild  Post-op Assessment: Post-op Vital signs reviewed, Patient's Cardiovascular Status Stable, Respiratory Function Stable, Patent Airway, No signs of Nausea or vomiting, Adequate PO intake and Pain level controlled  Post-op Vital Signs: stable  Complications: No apparent anesthesia complications

## 2012-02-23 NOTE — Brief Op Note (Signed)
02/23/2012  1:54 PM  PATIENT:  Brenda Yoder  66 y.o. female  PRE-OPERATIVE DIAGNOSIS:  mass right forearm  POST-OPERATIVE DIAGNOSIS:  Mass Right Forearm  PROCEDURE:  Procedure(s) (LRB): EXCISION MASS (Right)  SURGEON:  Surgeon(s) and Role:    * Nicki Reaper, MD - Primary  PHYSICIAN ASSISTANT:   ASSISTANTS: none   ANESTHESIA:   local and general  EBL:  Total I/O In: 1000 [I.V.:1000] Out: -   BLOOD ADMINISTERED:none  DRAINS: none   LOCAL MEDICATIONS USED:  MARCAINE     SPECIMEN:  Excision  DISPOSITION OF SPECIMEN:  PATHOLOGY  COUNTS:  YES  TOURNIQUET:   Total Tourniquet Time Documented: Upper Arm (Right) - 27 minutes  DICTATION: .Other Dictation: Dictation Number [id number]  PLAN OF CARE: Discharge to home after PACU  PATIENT DISPOSITION:  PACU - hemodynamically stable.

## 2012-02-23 NOTE — Op Note (Signed)
Dictated number: [id number] Bx: long tail of specimen proximal

## 2012-02-23 NOTE — H&P (Signed)
  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 She is complaining of a mass on the dorsal aspect of her forearm mid radial aspect. This was removed in 2006 but recurred. Pathology report was that of a lipoma. She has no history of pain. She does have a history of diabetes. She is not complaining of any pain or discomfort unless she hits the area. She is not complaining of any numbness or tingling. She states it has gradually enlarged. She has had an MRI done revealing a lipoma which goes down to the level of the radius  Past Medical History: She has no allergies. She is on Glucophage, Januvia, Lisinopril, Cymbalta, Pepcid, vitamins and Ambien. She has had a tonsillectomy, mastectomy, hysterectomy and cholecystectomy and knee replacement.   Family Medical History: Positive for heart disease, arthritis and high BP.  Social History: She does not smoke. She drinks socially. She is married and a retired Development worker, community.  Review of Systems: Positive for breast cancer, glasses, high BP, easy bruising, otherwise negative. Brenda Yoder is an 66 y.o. female.   Chief Complaint: mass rt arm HPI: see above   Past Medical History  Diagnosis Date  . Headache   . PONV (postoperative nausea and vomiting)     only with bil mastectomy  . Diabetes mellitus     NIDDM  . Hypertension   . Anxiety   . GERD (gastroesophageal reflux disease)     Past Surgical History  Procedure Date  . Tonsillectomy   . Abdominal hysterectomy   . Mastectomy     bil  . Joint replacement     lt knee  . Cholecystectomy     History reviewed. No pertinent family history. Social History:  reports that she has never smoked. She does not have any smokeless tobacco history on file. She reports that she drinks alcohol. She reports that she does not use illicit drugs.  Allergies: No Known Allergies  No prescriptions prior to admission    No results found for this or any previous visit (from the  past 48 hour(s)).  No results found.   Pertinent items are noted in HPI.  There were no vitals taken for this visit.  General appearance: cooperative and appears stated age Head: Normocephalic, without obvious abnormality Neck: no adenopathy Resp: clear to auscultation bilaterally Cardio: regular rate and rhythm, S1, S2 normal, no murmur, click, rub or gallop GI: soft, non-tender; bowel sounds normal; no masses,  no organomegaly Extremities: extremities normal, atraumatic, no cyanosis or edema Pulses: 2+ and symmetric Skin: Skin color, texture, turgor normal. No rashes or lesions Neurologic: Grossly normal Incision/Wound: na  Assessment/Plan Diagnosis: Soft tissue tumor unspecified. This is probably a recurrent lipoma She desires having this removed. The pre, peri and post op course are discussed along with risks and complications. She is aware there is no guarantee with surgery, possibility of infection, recurrence, injury to arteries, nerves and tendons, incomplete relief of symptoms and dystrophy.  She desires having this removed and this will be scheduled  for excision mass right forearm as an outpatient under regional or general anesthesia at her preference.   Brenda Yoder R 02/23/2012, 4:52 AM

## 2012-02-23 NOTE — Discharge Instructions (Addendum)

## 2012-02-23 NOTE — Anesthesia Procedure Notes (Signed)
Procedure Name: LMA Insertion Date/Time: 02/23/2012 1:12 PM Performed by: Caren Macadam Pre-anesthesia Checklist: Patient identified, Emergency Drugs available, Suction available and Patient being monitored Patient Re-evaluated:Patient Re-evaluated prior to inductionOxygen Delivery Method: Circle System Utilized Preoxygenation: Pre-oxygenation with 100% oxygen Intubation Type: IV induction Ventilation: Mask ventilation without difficulty LMA: LMA inserted LMA Size: 4.0 Number of attempts: 1 Airway Equipment and Method: bite block Placement Confirmation: positive ETCO2 and breath sounds checked- equal and bilateral Tube secured with: Tape Dental Injury: Teeth and Oropharynx as per pre-operative assessment

## 2012-02-23 NOTE — Anesthesia Preprocedure Evaluation (Signed)
Anesthesia Evaluation  Patient identified by MRN, date of birth, ID band Patient awake    Reviewed: Allergy & Precautions, H&P , NPO status , Patient's Chart, lab work & pertinent test results  History of Anesthesia Complications (+) PONV  Airway Mallampati: I TM Distance: >3 FB Neck ROM: Full    Dental   Pulmonary    Pulmonary exam normal       Cardiovascular hypertension,     Neuro/Psych  Headaches, Anxiety    GI/Hepatic GERD-  Medicated and Controlled,  Endo/Other  Diabetes mellitus-  Renal/GU      Musculoskeletal   Abdominal   Peds  Hematology   Anesthesia Other Findings   Reproductive/Obstetrics                           Anesthesia Physical Anesthesia Plan  ASA: III  Anesthesia Plan: General   Post-op Pain Management:    Induction: Intravenous  Airway Management Planned: LMA  Additional Equipment:   Intra-op Plan:   Post-operative Plan: Extubation in OR  Informed Consent: I have reviewed the patients History and Physical, chart, labs and discussed the procedure including the risks, benefits and alternatives for the proposed anesthesia with the patient or authorized representative who has indicated his/her understanding and acceptance.     Plan Discussed with: CRNA and Surgeon  Anesthesia Plan Comments:         Anesthesia Quick Evaluation

## 2012-02-23 NOTE — Transfer of Care (Signed)
Immediate Anesthesia Transfer of Care Note  Patient: Brenda Yoder  Procedure(s) Performed: Procedure(s) (LRB): EXCISION MASS (Right)  Patient Location: PACU  Anesthesia Type: General  Level of Consciousness: sedated  Airway & Oxygen Therapy: Patient Spontanous Breathing and Patient connected to face mask oxygen  Post-op Assessment: Report given to PACU RN and Post -op Vital signs reviewed and stable  Post vital signs: Reviewed and stable  Complications: No apparent anesthesia complications

## 2012-02-25 ENCOUNTER — Encounter (HOSPITAL_BASED_OUTPATIENT_CLINIC_OR_DEPARTMENT_OTHER): Payer: Self-pay | Admitting: Orthopedic Surgery

## 2012-05-02 ENCOUNTER — Encounter (HOSPITAL_COMMUNITY): Payer: Self-pay | Admitting: Emergency Medicine

## 2012-05-02 ENCOUNTER — Observation Stay (HOSPITAL_COMMUNITY)
Admission: EM | Admit: 2012-05-02 | Discharge: 2012-05-02 | Disposition: A | Discharge status: Home / Self Care | Payer: PRIVATE HEALTH INSURANCE | Attending: Internal Medicine | Admitting: Internal Medicine

## 2012-05-02 ENCOUNTER — Emergency Department (HOSPITAL_COMMUNITY): Payer: PRIVATE HEALTH INSURANCE

## 2012-05-02 ENCOUNTER — Encounter (HOSPITAL_COMMUNITY)
Admission: EM | Disposition: A | Discharge status: Home / Self Care | Payer: Self-pay | Source: Home / Self Care | Attending: Internal Medicine

## 2012-05-02 DIAGNOSIS — R0789 Other chest pain: Principal | ICD-10-CM | POA: Insufficient documentation

## 2012-05-02 DIAGNOSIS — R51 Headache: Secondary | ICD-10-CM | POA: Insufficient documentation

## 2012-05-02 DIAGNOSIS — R079 Chest pain, unspecified: Secondary | ICD-10-CM

## 2012-05-02 DIAGNOSIS — Z96659 Presence of unspecified artificial knee joint: Secondary | ICD-10-CM | POA: Insufficient documentation

## 2012-05-02 DIAGNOSIS — K219 Gastro-esophageal reflux disease without esophagitis: Secondary | ICD-10-CM | POA: Insufficient documentation

## 2012-05-02 DIAGNOSIS — E119 Type 2 diabetes mellitus without complications: Secondary | ICD-10-CM | POA: Insufficient documentation

## 2012-05-02 DIAGNOSIS — I2510 Atherosclerotic heart disease of native coronary artery without angina pectoris: Secondary | ICD-10-CM

## 2012-05-02 DIAGNOSIS — F411 Generalized anxiety disorder: Secondary | ICD-10-CM | POA: Insufficient documentation

## 2012-05-02 DIAGNOSIS — I10 Essential (primary) hypertension: Secondary | ICD-10-CM | POA: Insufficient documentation

## 2012-05-02 HISTORY — PX: LEFT HEART CATHETERIZATION WITH CORONARY ANGIOGRAM: SHX5451

## 2012-05-02 LAB — POCT I-STAT TROPONIN I

## 2012-05-02 LAB — LIPID PANEL
Cholesterol: 165 mg/dL (ref 0–200)
HDL: 44 mg/dL (ref >39)
Total CHOL/HDL Ratio: 3.8 RATIO

## 2012-05-02 LAB — CBC
Hemoglobin: 13.1 g/dL (ref 12.0–15.0)
MCH: 28.9 pg (ref 26.0–34.0)
MCV: 85.2 fL (ref 78.0–100.0)
RBC: 4.53 MIL/uL (ref 3.87–5.11)
WBC: 4.4 10*3/uL (ref 4.0–10.5)

## 2012-05-02 LAB — POCT I-STAT, CHEM 8
BUN: 11 mg/dL (ref 6–23)
Calcium, Ion: 1.23 mmol/L (ref 1.13–1.30)
Chloride: 102 mEq/L (ref 96–112)
Creatinine, Ser: 1 mg/dL (ref 0.50–1.10)
TCO2: 25 mmol/L (ref 0–100)

## 2012-05-02 LAB — MAGNESIUM: Magnesium: 2 mg/dL (ref 1.5–2.5)

## 2012-05-02 LAB — CARDIAC PANEL(CRET KIN+CKTOT+MB+TROPI)
CK, MB: 1.5 ng/mL (ref 0.3–4.0)
CK, MB: 1.5 ng/mL (ref 0.3–4.0)
Relative Index: INVALID (ref 0.0–2.5)
Total CK: 38 U/L (ref 7–177)
Troponin I: <0.3 ng/mL (ref <0.30)

## 2012-05-02 LAB — HEMOGLOBIN A1C: Mean Plasma Glucose: 148 mg/dL — ABNORMAL HIGH (ref <117)

## 2012-05-02 SURGERY — LEFT HEART CATHETERIZATION WITH CORONARY ANGIOGRAM
Anesthesia: LOCAL

## 2012-05-02 MED ORDER — DULOXETINE HCL 30 MG PO CPEP
30.0000 mg | ORAL_CAPSULE | Freq: Every day | ORAL | Status: DC
  Administered 2012-05-02: 30 mg via ORAL
  Filled 2012-05-02: qty 1

## 2012-05-02 MED ORDER — HYDROCHLOROTHIAZIDE 12.5 MG PO CAPS
12.5000 mg | ORAL_CAPSULE | Freq: Every day | ORAL | Status: DC
  Filled 2012-05-02: qty 1

## 2012-05-02 MED ORDER — ACETAMINOPHEN 325 MG PO TABS
650.0000 mg | ORAL_TABLET | ORAL | Status: DC | PRN
  Administered 2012-05-02: 650 mg via ORAL
  Filled 2012-05-02: qty 2

## 2012-05-02 MED ORDER — ONDANSETRON HCL 4 MG/2ML IJ SOLN
4.0000 mg | Freq: Once | INTRAMUSCULAR | Status: DC
  Filled 2012-05-02: qty 2

## 2012-05-02 MED ORDER — MORPHINE SULFATE 4 MG/ML IJ SOLN
4.0000 mg | Freq: Once | INTRAMUSCULAR | Status: DC
  Filled 2012-05-02: qty 1

## 2012-05-02 MED ORDER — METOPROLOL TARTRATE 100 MG PO TABS
100.0000 mg | ORAL_TABLET | Freq: Two times a day (BID) | ORAL | Status: DC
  Filled 2012-05-02 (×3): qty 1

## 2012-05-02 MED ORDER — NITROGLYCERIN 0.4 MG SL SUBL: 0.4000 mg | SUBLINGUAL_TABLET | SUBLINGUAL | Status: DC | PRN

## 2012-05-02 MED ORDER — LISINOPRIL 20 MG PO TABS
20.0000 mg | ORAL_TABLET | Freq: Every day | ORAL | Status: DC
  Administered 2012-05-02: 11:00:00 20 mg via ORAL
  Filled 2012-05-02: qty 1

## 2012-05-02 MED ORDER — DIAZEPAM 2 MG PO TABS: 2.0000 mg | ORAL_TABLET | ORAL | Status: DC | PRN

## 2012-05-02 MED ORDER — MIDAZOLAM HCL 2 MG/2ML IJ SOLN
INTRAMUSCULAR | Status: AC
  Filled 2012-05-02: qty 2

## 2012-05-02 MED ORDER — SODIUM CHLORIDE 0.9 % IJ SOLN: 3.0000 mL | Freq: Two times a day (BID) | INTRAMUSCULAR | Status: DC

## 2012-05-02 MED ORDER — LISINOPRIL-HYDROCHLOROTHIAZIDE 20-12.5 MG PO TABS: 1.0000 | ORAL_TABLET | Freq: Every day | ORAL | Status: DC

## 2012-05-02 MED ORDER — CALCIUM CARBONATE 1250 (500 CA) MG PO TABS
1.0000 | ORAL_TABLET | Freq: Two times a day (BID) | ORAL | Status: DC
  Administered 2012-05-02: 14:00:00 500 mg via ORAL
  Filled 2012-05-02 (×3): qty 1

## 2012-05-02 MED ORDER — OMEGA-3-ACID ETHYL ESTERS 1 G PO CAPS
1.0000 g | ORAL_CAPSULE | Freq: Two times a day (BID) | ORAL | Status: DC
  Administered 2012-05-02: 1 g via ORAL
  Filled 2012-05-02 (×2): qty 1

## 2012-05-02 MED ORDER — ENOXAPARIN SODIUM 40 MG/0.4ML ~~LOC~~ SOLN
40.0000 mg | SUBCUTANEOUS | Status: DC
  Filled 2012-05-02: qty 0.4

## 2012-05-02 MED ORDER — HEPARIN (PORCINE) IN NACL 2-0.9 UNIT/ML-% IJ SOLN
INTRAMUSCULAR | Status: AC
  Filled 2012-05-02: qty 2000

## 2012-05-02 MED ORDER — METOPROLOL TARTRATE 25 MG PO TABS: 25.0000 mg | ORAL_TABLET | Freq: Two times a day (BID) | ORAL | Status: DC

## 2012-05-02 MED ORDER — METOPROLOL TARTRATE 25 MG PO TABS
25.0000 mg | ORAL_TABLET | Freq: Two times a day (BID) | ORAL | Status: DC
  Administered 2012-05-02: 25 mg via ORAL
  Filled 2012-05-02: qty 1

## 2012-05-02 MED ORDER — LIDOCAINE HCL (PF) 1 % IJ SOLN
INTRAMUSCULAR | Status: AC
  Filled 2012-05-02: qty 30

## 2012-05-02 MED ORDER — FAMOTIDINE 20 MG PO TABS
20.0000 mg | ORAL_TABLET | Freq: Two times a day (BID) | ORAL | Status: DC
  Administered 2012-05-02: 20 mg via ORAL
  Filled 2012-05-02 (×2): qty 1

## 2012-05-02 MED ORDER — ASPIRIN EC 81 MG PO TBEC
81.0000 mg | DELAYED_RELEASE_TABLET | Freq: Every day | ORAL | Status: DC
  Filled 2012-05-02: qty 1

## 2012-05-02 MED ORDER — ATORVASTATIN CALCIUM 10 MG PO TABS
10.0000 mg | ORAL_TABLET | Freq: Every day | ORAL | Status: DC
  Filled 2012-05-02: qty 1

## 2012-05-02 MED ORDER — NITROGLYCERIN 0.2 MG/ML ON CALL CATH LAB
INTRAVENOUS | Status: AC
  Filled 2012-05-02: qty 1

## 2012-05-02 MED ORDER — ASPIRIN 81 MG PO CHEW
324.0000 mg | CHEWABLE_TABLET | ORAL | Status: AC
  Administered 2012-05-02: 11:00:00 324 mg via ORAL
  Filled 2012-05-02: qty 4

## 2012-05-02 MED ORDER — DIAZEPAM 5 MG PO TABS
5.0000 mg | ORAL_TABLET | ORAL | Status: AC
  Administered 2012-05-02: 11:00:00 5 mg via ORAL
  Filled 2012-05-02: qty 1

## 2012-05-02 MED ORDER — ONDANSETRON HCL 4 MG/2ML IJ SOLN: 4.0000 mg | Freq: Four times a day (QID) | INTRAMUSCULAR | Status: DC | PRN

## 2012-05-02 MED ORDER — SODIUM CHLORIDE 0.9 % IV SOLN
1.0000 mL/kg/h | INTRAVENOUS | Status: DC
  Administered 2012-05-02: 11:00:00 1 mL/kg/h via INTRAVENOUS

## 2012-05-02 MED ORDER — METOPROLOL TARTRATE 1 MG/ML IV SOLN
INTRAVENOUS | Status: AC
  Filled 2012-05-02: qty 5

## 2012-05-02 MED ORDER — ASPIRIN 81 MG PO CHEW
324.0000 mg | CHEWABLE_TABLET | Freq: Once | ORAL | Status: AC
  Administered 2012-05-02: 324 mg via ORAL
  Filled 2012-05-02: qty 4

## 2012-05-02 MED ORDER — SODIUM CHLORIDE 0.9 % IV SOLN: INTRAVENOUS | Status: AC

## 2012-05-02 MED ORDER — FENTANYL CITRATE 0.05 MG/ML IJ SOLN
INTRAMUSCULAR | Status: AC
  Filled 2012-05-02: qty 2

## 2012-05-02 MED ORDER — METFORMIN HCL 1000 MG PO TABS: 1000.0000 mg | ORAL_TABLET | Freq: Two times a day (BID) | ORAL | Status: DC

## 2012-05-02 NOTE — H&P (View-Only) (Signed)
Subjective:  Brenda Yoder provides a history with recurrent episodes of exertional jaw pain and some chest pain while working on her treadmill over the past few weeks that has accelerated prompting admission to the hospital.  She had not noted this previously, but it has been recurrent.  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  Nonetheless, she has a five year history of DM, and now symptoms that are worrisome for accelerating angina.    Objective:  Vital Signs in the last 24 hours: Temp:  [97.5 F (36.4 C)-98.2 F (36.8 C)] 98.2 F (36.8 C) (08/06 0817) Pulse Rate:  [65-82] 72  (08/06 0817) Resp:  [11-21] 11  (08/06 0817) BP: (124-184)/(54-96) 143/70 mmHg (08/06 0817) SpO2:  [95 %-99 %] 97 % (08/06 0817) Weight:  [191 lb 9.3 oz (86.9 kg)] 191 lb 9.3 oz (86.9 kg) (08/06 0304)  Intake/Output from previous day:     Physical Exam: General: Well developed, well nourished, in no acute distress. Head:  Normocephalic and atraumatic. Lungs: Clear to auscultation and percussion. Heart: Normal S1 and S2.  No murmur, rubs or gallops.  Neurologic: Alert and oriented x 3.    Lab Results:  Basename 05/02/12 0121 05/02/12 0105  WBC -- 4.4  HGB 13.6 13.1  PLT -- 135*    Basename 05/02/12 0121  NA 139  K 3.9  CL 102  CO2 --  GLUCOSE 135*  BUN 11  CREATININE 1.00    Basename 05/02/12 0857 05/02/12 0309  TROPONINI <0.30 <0.30   Hepatic Function Panel No results found for this basename: PROT,ALBUMIN,AST,ALT,ALKPHOS,BILITOT,BILIDIR,IBILI in the last 72 hours  Basename 05/02/12 0309  CHOL 165   No results found for this basename: PROTIME in the last 72 hours  Imaging: Dg Chest Portable 1 View  05/02/2012  *RADIOLOGY REPORT*  Clinical Data: Jaw pain.  Chest discomfort.  Shortness of breath.  PORTABLE CHEST - 1 VIEW  Comparison: 05/17/2009  Findings: Shallow inspiration. The heart size and pulmonary vascularity are normal. The lungs  appear clear and expanded without focal air space disease or consolidation. No blunting of the costophrenic angles.  No pneumothorax.  No significant change since previous study.  IMPRESSION: No evidence of active pulmonary disease.  Original Report Authenticated By: WILLIAM R. STEVENS, M.D.    EKG:  No acute changes.  NSR.    Cardiac Studies:  Enzymes negative times three  Assessment/Plan:  Exertional chest tightness worrisome for progressive AP Type II DM Hypertension  Plan  I have discussed the options with Freddie, who I have known for many years.  She is having fairly prominent symptoms which have worsened over the past few weeks, and she has multiple cardiac risk factors.  As such, we think cath is the best option for her as her GXT is technically positive by history alone.  She is agreeable and wishes to proceed.        Jeyden Coffelt, MD, FACC, FSCAI 05/02/2012, 11:06 AM    

## 2012-05-02 NOTE — ED Notes (Signed)
MD at bedside. 

## 2012-05-02 NOTE — ED Notes (Signed)
Report given to [facility], receiving RN for pt to room 6531.

## 2012-05-02 NOTE — ED Provider Notes (Signed)
History     CSN: [id number]  Arrival date & time 05/02/12  0034   First MD Initiated Contact with Patient 05/02/12 0036      Chief Complaint  Patient presents with  . Chest Pain  . Hypertension    (Consider location/radiation/quality/duration/timing/severity/associated sxs/prior treatment) HPI HX per PT. Jaw pain on and off today, improved with rest but tonight also developed about 40 min of CP described as tightness across the chest. No SOB, nausea or sig diaphoresis. No known alleviating factors. Took ASA earlier in the day. No h/o CAD or early FH of the same. No leg pain or swelling, no h/o DVT or PE.  No fevers or recent illness, no cough, no trauma. No h/o similar CP. Currently chest pain-free with 3/10 jaw pain. Past Medical History  Diagnosis Date  . Headache   . PONV (postoperative nausea and vomiting)     only with bil mastectomy  . Diabetes mellitus     NIDDM  . Hypertension   . Anxiety   . GERD (gastroesophageal reflux disease)     Past Surgical History  Procedure Date  . Tonsillectomy   . Abdominal hysterectomy   . Mastectomy     bil  . Joint replacement     lt knee  . Cholecystectomy   . Mass excision 02/23/2012    Procedure: EXCISION MASS;  Surgeon: Nicki Reaper, MD;  Location: [facility];  Service: Orthopedics;  Laterality: Right;  right forearm    No family history on file.  History  Substance Use Topics  . Smoking status: Never Smoker   . Smokeless tobacco: Not on file  . Alcohol Use: Yes     social    OB History    Grav Para Term Preterm Abortions TAB SAB Ect Mult Living                  Review of Systems  Constitutional: Negative for fever and chills.  Respiratory: Negative for shortness of breath.   Cardiovascular: Positive for chest pain.  Gastrointestinal: Negative for abdominal pain.  Musculoskeletal: Negative for back pain.  Skin: Negative for rash.  Neurological: Negative for syncope.  All other systems reviewed  and are negative.    Allergies  Review of patient's allergies indicates no known allergies.  Home Medications   Current Outpatient Rx  Name Route Sig Dispense Refill  . ASPIRIN EC 81 MG PO TBEC Oral Take 81 mg by mouth daily.    . ATORVASTATIN CALCIUM 10 MG PO TABS Oral Take 10 mg by mouth daily.    Marland Kitchen CALCIUM CARBONATE 1250 MG PO TABS Oral Take 1 tablet by mouth 2 (two) times daily.     . DULOXETINE HCL 30 MG PO CPEP Oral Take 30 mg by mouth daily.    Marland Kitchen FAMOTIDINE 20 MG PO TABS Oral Take 20 mg by mouth 2 (two) times daily.    . OMEGA-3 FATTY ACIDS 1000 MG PO CAPS Oral Take 1 g by mouth 2 times daily at 12 noon and 4 pm.     . LISINOPRIL-HYDROCHLOROTHIAZIDE 20-12.5 MG PO TABS Oral Take 1 tablet by mouth daily.    Marland Kitchen METFORMIN HCL 1000 MG PO TABS Oral Take 1,000 mg by mouth 2 (two) times daily with a meal.    . METOPROLOL TARTRATE 100 MG PO TABS Oral Take 100 mg by mouth 2 (two) times daily.    Marland Kitchen SITAGLIPTIN PHOSPHATE 100 MG PO TABS Oral Take 100 mg by  mouth daily.    Marland Kitchen ZOLPIDEM TARTRATE 10 MG PO TABS Oral Take 10 mg by mouth at bedtime as needed. sleep      BP 177/82  Pulse 84  Temp 98.2 F (36.8 C)  Resp 16  SpO2 99%  Physical Exam  Constitutional: She is oriented to person, place, and time. She appears well-developed and well-nourished.  HENT:  Head: Normocephalic and atraumatic.  Eyes: EOM are normal. Pupils are equal, round, and reactive to light.  Neck: Neck supple. No JVD present.  Cardiovascular: Normal rate, regular rhythm and normal heart sounds.   Pulmonary/Chest: Effort normal and breath sounds normal. No respiratory distress.  Abdominal: She exhibits no distension.  Musculoskeletal: She exhibits no edema.       No c/c/e  Neurological: She is alert and oriented to person, place, and time. No cranial nerve deficit.  Skin: Skin is warm and dry. She is not diaphoretic.    ED Course  Procedures (including critical care time)  Results for orders placed during the  hospital encounter of 05/02/12  CBC      Component Value Range   WBC 4.4  4.0 - 10.5 K/uL   RBC 4.53  3.87 - 5.11 MIL/uL   Hemoglobin 13.1  12.0 - 15.0 g/dL   HCT 62.1  30.8 - 65.7 %   MCV 85.2  78.0 - 100.0 fL   MCH 28.9  26.0 - 34.0 pg   MCHC 33.9  30.0 - 36.0 g/dL   RDW 84.6  96.2 - 95.2 %   Platelets 135 (*) 150 - 400 K/uL  POCT I-STAT, CHEM 8      Component Value Range   Sodium 139  135 - 145 mEq/L   Potassium 3.9  3.5 - 5.1 mEq/L   Chloride 102  96 - 112 mEq/L   BUN 11  6 - 23 mg/dL   Creatinine, Ser 8.41  0.50 - 1.10 mg/dL   Glucose, Bld 324 (*) 70 - 99 mg/dL   Calcium, Ion 4.01  0.27 - 1.30 mmol/L   TCO2 25  0 - 100 mmol/L   Hemoglobin 13.6  12.0 - 15.0 g/dL   HCT 25.3  66.4 - 40.3 %  POCT I-STAT TROPONIN I      Component Value Range   Troponin i, poc 0.00  0.00 - 0.08 ng/mL   Comment 3           CARDIAC PANEL(CRET KIN+CKTOT+MB+TROPI)      Component Value Range   Total CK 47  7 - 177 U/L   CK, MB 1.9  0.3 - 4.0 ng/mL   Troponin I <0.30  <0.30 ng/mL   Relative Index RELATIVE INDEX IS INVALID  0.0 - 2.5  MAGNESIUM      Component Value Range   Magnesium 2.0  1.5 - 2.5 mg/dL  PRO B NATRIURETIC PEPTIDE      Component Value Range   Pro B Natriuretic peptide (BNP) 147.9 (*) 0 - 125 pg/mL  LIPID PANEL      Component Value Range   Cholesterol 165  0 - 200 mg/dL   Triglycerides 474 (*) <150 mg/dL   HDL 44  >25 mg/dL   Total CHOL/HDL Ratio 3.8     VLDL 38  0 - 40 mg/dL   LDL Cholesterol 83  0 - 99 mg/dL   Dg Chest Portable 1 View  05/02/2012  *RADIOLOGY REPORT*  Clinical Data: Jaw pain.  Chest discomfort.  Shortness of breath.  PORTABLE  CHEST - 1 VIEW  Comparison: 05/17/2009  Findings: Shallow inspiration. The heart size and pulmonary vascularity are normal. The lungs appear clear and expanded without focal air space disease or consolidation. No blunting of the costophrenic angles.  No pneumothorax.  No significant change since previous study.  IMPRESSION: No evidence of  active pulmonary disease.  Original Report Authenticated By: Marlon Pel, M.D.     Date: 05/02/2012  Rate: 82  Rhythm: normal sinus rhythm  QRS Axis: normal  Intervals: normal  ST/T Wave abnormalities: nonspecific ST changes  Conduction Disutrbances:none  Narrative Interpretation:   Old EKG Reviewed: none available  Aspirin. On recheck pain resolved prior to receiving nitroglycerin.  EKG, labs and imaging reviewed as above.  Cardiology consult obtained. Fellow on-call agrees to evaluation and admission.   MDM   Chest pain evaluated with stat EKG, chest x-ray and troponin and labs as above.  Aspirin PO. Nursing notes reviewed. Vital signs reviewed.  Cardiology consult and admission.        Sunnie Nielsen, MD 05/02/12 [phone]

## 2012-05-02 NOTE — H&P (Signed)
Brenda Yoder is an 66 y.o. female.    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 She reports having a 5/10 exertional chest pain yesterday that was mainly described as bilateral jaw pain. The pain lasted for about 40 minutes before relief.  She denies any nausea, vomiting, diaphoresis or shortness of breath.  In ED, her ECG showed sinus rhythm with no definite ST-elevation, and her initial cardiac marker is negative.  She has no prior history of myocardial infarction or any prior cardiac evaluation, and she does not use tobacco. Currently, she is chest pain free, and she is clinically and hemodynamically stable.  Past Medical History  Diagnosis Date  . Headache   . PONV (postoperative nausea and vomiting)     only with bil mastectomy  . Diabetes mellitus     NIDDM  . Hypertension   . Anxiety   . GERD (gastroesophageal reflux disease)      Past Surgical History  Procedure Date  . Tonsillectomy   . Abdominal hysterectomy   . Mastectomy     bil  . Joint replacement     lt knee  . Cholecystectomy   . Mass excision 02/23/2012    Procedure: EXCISION MASS;  Surgeon: Nicki Reaper, MD;  Location: [facility];  Service: Orthopedics;  Laterality: Right;  right forearm    No family history on file. Social History:  reports that she has never smoked. She does not have any smokeless tobacco history on file. She reports that she drinks alcohol. She reports that she does not use illicit drugs.  Allergies: No Known Allergies  Medication: Aspirin 81 mg q day Lipitor 10 mg qhs Cymbalta 30 mg q day Pepcid 20 mg bid Fish Oil 1 gram bid Lisinopril/HCTZ 20/12.5 mg q day Metformin 1000 mg bid Januvia 100 mg q day  (Not in a hospital admission)  Results for orders placed during the hospital encounter of 05/02/12 (from the past 48 hour(s))  CBC     Status: Abnormal   Collection Time   05/02/12  1:05 AM      Component Value Range Comment   WBC 4.4  4.0 - 10.5 K/uL    RBC 4.53  3.87 - 5.11 MIL/uL    Hemoglobin 13.1  12.0 - 15.0 g/dL    HCT 16.1  09.6 - 04.5 %    MCV 85.2  78.0 - 100.0 fL    MCH 28.9  26.0 - 34.0 pg    MCHC 33.9  30.0 - 36.0 g/dL    RDW 40.9  81.1 - 91.4 %    Platelets 135 (*) 150 - 400 K/uL   POCT I-STAT TROPONIN I     Status: Normal   Collection Time   05/02/12  1:19 AM      Component Value Range Comment   Troponin i, poc 0.00  0.00 - 0.08 ng/mL    Comment 3            POCT I-STAT, CHEM 8     Status: Abnormal   Collection Time   05/02/12  1:21 AM      Component Value Range Comment   Sodium 139  135 - 145 mEq/L    Potassium 3.9  3.5 - 5.1 mEq/L    Chloride 102  96 - 112 mEq/L    BUN 11  6 - 23 mg/dL  Creatinine, Ser 1.00  0.50 - 1.10 mg/dL    Glucose, Bld 161 (*) 70 - 99 mg/dL    Calcium, Ion 0.96  0.45 - 1.30 mmol/L    TCO2 25  0 - 100 mmol/L    Hemoglobin 13.6  12.0 - 15.0 g/dL    HCT 40.9  81.1 - 91.4 %    Dg Chest Portable 1 View  05/02/2012  *RADIOLOGY REPORT*  Clinical Data: Jaw pain.  Chest discomfort.  Shortness of breath.  PORTABLE CHEST - 1 VIEW  Comparison: 05/17/2009  Findings: Shallow inspiration. The heart size and pulmonary vascularity are normal. The lungs appear clear and expanded without focal air space disease or consolidation. No blunting of the costophrenic angles.  No pneumothorax.  No significant change since previous study.  IMPRESSION: No evidence of active pulmonary disease.  Original Report Authenticated By: Marlon Pel, M.D.    Review of Systems  Constitutional: Negative for fever, chills, weight loss, malaise/fatigue and diaphoresis.  HENT: Negative for hearing loss, ear pain, nosebleeds, congestion, sore throat, neck pain, tinnitus and ear discharge.   Eyes: Negative for blurred vision, double vision, photophobia, pain, discharge and redness.  Respiratory: Negative for cough, hemoptysis,  sputum production, shortness of breath, wheezing and stridor.   Cardiovascular: Positive for chest pain. Negative for palpitations, orthopnea, claudication, leg swelling and PND.  Gastrointestinal: Negative for heartburn, nausea, vomiting, abdominal pain, diarrhea, constipation, blood in stool and melena.  Genitourinary: Negative for dysuria, urgency, frequency, hematuria and flank pain.  Musculoskeletal: Negative for myalgias, back pain, joint pain and falls.  Skin: Negative for itching and rash.  Neurological: Negative for dizziness, tingling, tremors, sensory change, speech change, focal weakness, seizures, loss of consciousness, weakness and headaches.  Psychiatric/Behavioral: Negative for suicidal ideas and hallucinations.    Blood pressure 128/67, pulse 71, temperature 98.2 F (36.8 C), resp. rate 18, SpO2 98.00%. Physical Exam  Constitutional: She is oriented to person, place, and time. She appears well-developed and well-nourished. No distress.  HENT:  Head: Normocephalic and atraumatic.  Eyes: Right eye exhibits no discharge. Left eye exhibits no discharge.  Neck: Normal range of motion. Neck supple. No JVD present. No tracheal deviation present. No thyromegaly present.  Cardiovascular: Normal rate, regular rhythm and normal heart sounds.  Exam reveals no gallop and no friction rub.   No murmur heard. Respiratory: Effort normal and breath sounds normal. No stridor. No respiratory distress. She has no wheezes. She has no rales. She exhibits no tenderness.       Bilateral mastectomy  GI: Soft. Bowel sounds are normal. She exhibits no distension. There is no tenderness. There is no rebound and no guarding.  Musculoskeletal: She exhibits no edema and no tenderness.  Neurological: She is alert and oriented to person, place, and time.  Skin: No rash noted. She is not diaphoretic. No erythema. No pallor.  Psychiatric: She has a normal mood and affect.     Assessment/Plan  1. Chest  pain 2. DM 3. HTN 4. Hyperlipidemia  Patient is currently chest pain free and is clinically stable. Her initial cardiac markers are normal.  Her chest pain has typical features, and she has several risk factors for CAD.  Patient prefers to undergo cardiac catheterization and would prefer the heart cath to be done by [facility] so that she can follow-up with [facility] cardiology in [place] where she lives.  I will hold her Metformin and keep her NPO for a cardiac cath in the morning.  Deontez Klinke  E 05/02/2012, 2:45 AM

## 2012-05-02 NOTE — Discharge Summary (Signed)
Discharge Summary   Patient ID: Brenda Yoder MRN: [id number], DOB/AGE: 1946-06-15 66 y.o.  Primary MD: Dwana Melena, MD Primary Cardiologist: New to [place], follow up in [place] Admit date: 05/02/2012 D/C date:     05/02/2012      Primary Discharge Diagnoses:  1. Chest pain, Noncardiac  - Cath on 05/02/12 revealed diffuse coronary calcification without critical focal CAD and preserved LV function  - Medical therapy including initiation of low dose BB  Secondary Discharge Diagnoses:  1. Diabetes Mellitus, Type 2 2. Hypertension 3. Anxiety 4. GERD 5. Headache 6. Abdominal Hysterectomy 7. Bilateral Mastectomy 8. Left Knee Replacement 9. Cholecystectomy 10. Tonsillectomy 11. Right Forearm mass excision 02/23/12  Allergies No Known Allergies  Diagnostic Studies/Procedures:   05/02/12 - Cardiac Cath Hemodynamics:  AO 157/79 (112)  LV 156/6  No gradient on pullback  Coronary angiography:  Coronary dominance: left  On plain fluoroscopy there is at least moderate coronary calcification, particularly of the LAD, consistent with underlying coronary artery plaque  Left mainstem: Large vessel that bifurcates to a dominant circumflex and wrap around LAD.  Left anterior descending (LAD): The vessel has heavy calcification in the mid vessel. There is likely diffuse 30-40% plaque throughout the mid LAD in an area of extensive, heavy, segmental coronary calcification. The diagonal has about 30-40% segmental proximal irregularity, non focal. The distal LAD has mild diffuse luminal irregularity that is non obstructive. The apical vessel supplies significant distal inferior myocardium.  Left circumflex (LCx): This a dominant vessel with proximal calcification. The ramus branch has mild proximal irregularity with 40% segmental plaque proximally. There is then a large marginal, smaller PLA, and PDA which are without focal narrowing.  Right coronary artery (RCA): This is non dominant vessel with a  smooth 50-60% proximal narrowing after IC TNG  Left ventriculography: Left ventricular systolic function is normal, LVEF is estimated at 55-65%, there is no significant mitral regurgitation  Final Conclusions:  1. Diffuse coronary calcification without critical focal CAD  2. Moderate diffuse plaquing of the coronaries as noted.  3. Preserved overall LV function.  Recommendations:  1. Heavy focus on lifestyle modification  2. Add low dose beta blockade to regimen  3. Follow up in  clinic    History of Present Illness: 66 y.o. female w/ the above medical problems who presented to [facility] on 05/02/12 with complaints of chest pain. She reported progressive exertional chest pain over the past few weeks with an episode that lasted 40 minutes on day of presentation prompting her to present to the ED.  Hospital Course: EKG revealed NSR with no acute ST/T changes. CXR was without acute cardiopulmonary abnormalities. Labs were significant for normal poc troponin and unremarkable BMET/CBC. She was admitted for further evaluation and treatment.   Cardiac enzymes were cycled and remained negative. Given to her cardiac risk factors and symptoms concerning for angina it was felt she would benefit from cardiac cath. Cath on 05/02/12 revealed diffuse coronary calcification without critical focal CAD and preserved LV function. She tolerated the procedure well without complications. Recommendations were made for addition of low dose beta blocker as well as aggressive lifestyle modification. Metoprolol tartrate 25mg  BID was initiated. She was able to ambulate without chest pain or sob. Cath site remained stable. She was seen and evaluated by Dr. Riley Kill who felt she was stable for discharge home with plans for follow up as scheduled below.  Discharge Vitals: Blood pressure 140/65, pulse 57, temperature 97.7 F (36.5 C), temperature source  Oral, resp. rate 17, height 5\' 3"  (1.6 m), weight 191 lb 9.3  oz (86.9 kg), SpO2 98.00%.  Labs: Component Value Date   WBC 4.4 05/02/2012   HGB 13.6 05/02/2012   HCT 40.0 05/02/2012   MCV 85.2 05/02/2012   PLT 135* 05/02/2012    Lab 05/02/12 0121  NA 139  K 3.9  CL 102  CO2 --  BUN 11  CREATININE 1.00  GLUCOSE 135*  MAGNESIUM 2.0     05/02/2012 03:09  Pro B Natriuretic peptide (BNP) 147.9 (H)   Basename 05/02/12 1425 05/02/12 0857 05/02/12 0309  CKTOTAL 38 45 47  CKMB 1.5 1.5 1.9  TROPONINI <0.30 <0.30 <0.30   Component Value Date   CHOL 165 05/02/2012   HDL 44 05/02/2012   LDLCALC 83 05/02/2012   TRIG 188* 05/02/2012     05/02/2012 03:20  Hemoglobin A1C 6.8 (H)     05/02/2012 03:20  TSH 2.177    Discharge Medications   Medication List  As of 05/02/2012  5:54 PM   TAKE these medications         aspirin EC 81 MG tablet   Take 81 mg by mouth daily.      atorvastatin 10 MG tablet   Commonly known as: LIPITOR   Take 10 mg by mouth daily.      calcium carbonate 1250 MG tablet   Commonly known as: OS-CAL - dosed in mg of elemental calcium   Take 1 tablet by mouth 2 (two) times daily.      DULoxetine 30 MG capsule   Commonly known as: CYMBALTA   Take 30 mg by mouth daily.      famotidine 20 MG tablet   Commonly known as: PEPCID   Take 20 mg by mouth 2 (two) times daily.      fish oil-omega-3 fatty acids 1000 MG capsule   Take 1 g by mouth 2 times daily at 12 noon and 4 pm.      lisinopril-hydrochlorothiazide 20-12.5 MG per tablet   Commonly known as: PRINZIDE,ZESTORETIC   Take 1 tablet by mouth daily.      metFORMIN 1000 MG tablet   Commonly known as: GLUCOPHAGE   Take 1 tablet (1,000 mg total) by mouth 2 (two) times daily with a meal. Please hold for 48hrs after cardiac catheterization. Resume on 05/04/12      metoprolol tartrate 25 MG tablet   Commonly known as: LOPRESSOR   Take 1 tablet (25 mg total) by mouth 2 (two) times daily.      sitaGLIPtin 100 MG tablet   Commonly known as: JANUVIA   Take 100 mg by mouth daily.       zolpidem 10 MG tablet   Commonly known as: AMBIEN   Take 10 mg by mouth at bedtime as needed. sleep            Disposition   Discharge Orders    Future Appointments: Provider: Department: Dept Phone: Center:   05/05/2012 2:45 PM Wendall Stade, MD [facility] [phone] [id number]     Future Orders Please Complete By Expires   Diet - low sodium heart healthy      Increase activity slowly      Discharge instructions      Comments:   **PLEASE REMEMBER TO BRING ALL OF YOUR MEDICATIONS TO EACH OF YOUR FOLLOW-UP OFFICE VISITS.  * KEEP GROIN SITE CLEAN AND DRY. Call the office for any signs of bleedings, pus, swelling, increased pain, or  any other concerns. * NO HEAVY LIFTING (>10lbs) OR SEXUAL ACTIVITY X 7 DAYS. * NO DRIVING X 2-3 DAYS. * NO SOAKING BATHS, HOT TUBS, POOLS, ETC., X 7 DAYS.  * Please hold metformin for 48hrs after cardiac catheterization. Resume on 05/04/12     Follow-up Information    Follow up with [facility]. (Our office will call you with your appointment time.)    Contact information:   [facility] [address] [place] [address] [phone]          Outstanding Labs/Studies:  None  Duration of Discharge Encounter: Greater than 30 minutes including physician and PA time.  Signed, Khady Vandenberg PA-C 05/02/2012, 5:54 PM

## 2012-05-02 NOTE — ED Notes (Signed)
Pt stated, "I don't have any jaw tightness at this time." Pt also denies c/p at this time. PT refused zofran and morphine. MD notified. Will continue to monitor pt.

## 2012-05-02 NOTE — ED Notes (Signed)
PT in restroom at this time 

## 2012-05-02 NOTE — ED Notes (Signed)
65/F brought to ER by family member for c/o intermittent c/p and tightness that began yesterday morning while walking the dog. Pt also c/o jaw tightness. Pt took 324 mg ASA yesterday at 1100 am at onset of c/p. Pt AAO x 4, NAD at this time and breathing even and unlabored. Placed on cardiac monitor and sp02 monitor. MD at bedside. Will continue to monitor pt.

## 2012-05-02 NOTE — Progress Notes (Signed)
Subjective:  Brenda Yoder provides a history with recurrent episodes of exertional jaw pain and some chest pain while working on her treadmill over the past few weeks that has accelerated prompting admission to the hospital.  She had not noted this previously, but it has been recurrent.  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  Nonetheless, she has a five year history of DM, and now symptoms that are worrisome for accelerating angina.    Objective:  Vital Signs in the last 24 hours: Temp:  [97.5 F (36.4 C)-98.2 F (36.8 C)] 98.2 F (36.8 C) (08/06 0817) Pulse Rate:  [65-82] 72  (08/06 0817) Resp:  [11-21] 11  (08/06 0817) BP: (124-184)/(54-96) 143/70 mmHg (08/06 0817) SpO2:  [95 %-99 %] 97 % (08/06 0817) Weight:  [191 lb 9.3 oz (86.9 kg)] 191 lb 9.3 oz (86.9 kg) (08/06 0304)  Intake/Output from previous day:     Physical Exam: General: Well developed, well nourished, in no acute distress. Head:  Normocephalic and atraumatic. Lungs: Clear to auscultation and percussion. Heart: Normal S1 and S2.  No murmur, rubs or gallops.  Neurologic: Alert and oriented x 3.    Lab Results:  Basename 05/02/12 0121 05/02/12 0105  WBC -- 4.4  HGB 13.6 13.1  PLT -- 135*    Basename 05/02/12 0121  NA 139  K 3.9  CL 102  CO2 --  GLUCOSE 135*  BUN 11  CREATININE 1.00    Basename 05/02/12 0857 05/02/12 0309  TROPONINI <0.30 <0.30   Hepatic Function Panel No results found for this basename: PROT,ALBUMIN,AST,ALT,ALKPHOS,BILITOT,BILIDIR,IBILI in the last 72 hours  Basename 05/02/12 0309  CHOL 165   No results found for this basename: PROTIME in the last 72 hours  Imaging: Dg Chest Portable 1 View  05/02/2012  *RADIOLOGY REPORT*  Clinical Data: Jaw pain.  Chest discomfort.  Shortness of breath.  PORTABLE CHEST - 1 VIEW  Comparison: 05/17/2009  Findings: Shallow inspiration. The heart size and pulmonary vascularity are normal. The lungs  appear clear and expanded without focal air space disease or consolidation. No blunting of the costophrenic angles.  No pneumothorax.  No significant change since previous study.  IMPRESSION: No evidence of active pulmonary disease.  Original Report Authenticated By: Marlon Pel, M.D.    EKG:  No acute changes.  NSR.    Cardiac Studies:  Enzymes negative times three  Assessment/Plan:  Exertional chest tightness worrisome for progressive AP Type II DM Hypertension  Plan  I have discussed the options with Brenda Yoder, who I have known for many years.  She is having fairly prominent symptoms which have worsened over the past few weeks, and she has multiple cardiac risk factors.  As such, we think cath is the best option for her as her GXT is technically positive by history alone.  She is agreeable and wishes to proceed.        Shawnie Pons, MD, [facility], FSCAI 05/02/2012, 11:06 AM

## 2012-05-02 NOTE — Interval H&P Note (Signed)
History and Physical Interval Note:  05/02/2012 11:16 AM  Brenda Yoder  has presented today for surgery, with the diagnosis of chest pain  The various methods of treatment have been discussed with the patient. After consideration of risks, benefits and other options for treatment, the patient has consented to  Procedure(s) (LRB): LEFT HEART CATHETERIZATION WITH CORONARY ANGIOGRAM (N/A) as a surgical intervention .  The patient's history has been reviewed, patient examined, no change in status, stable for surgery.  I have reviewed the patient's chart and labs.  Questions were answered to the patient's satisfaction.     Shawnie Pons

## 2012-05-02 NOTE — CV Procedure (Signed)
   Cardiac Catheterization Procedure Note  Name: Brenda Yoder MRN: [id number] DOB: May 02, 1946  Procedure: Left Heart Cath, Selective Coronary Angiography, LV angiography  Indication: positive chest pain on treadmill with progressive pattern.     Procedural details: The right groin was prepped, draped, and anesthetized with 1% lidocaine. Using modified Seldinger technique, a 4 French sheath was introduced into the right femoral artery. Standard Judkins catheters were used for coronary angiography and left ventriculography. Catheter exchanges were performed over a guidewire. There were no immediate procedural complications. The patient was transferred to the post catheterization recovery area for further monitoring.  We did give IV metoprolol for BP control.  I spoke with her husband and reviewed the films with him at the completion of the procedure.   Procedural Findings: Hemodynamics:  AO 157/79 (112) LV 156/6 No gradient on pullback   Coronary angiography: Coronary dominance: left  On plain fluoroscopy there is at least moderate coronary calcification, particularly of the LAD, consistent with underlying coronary artery plaque  Left mainstem: Large vessel that bifurcates to a dominant circumflex and wrap around LAD.  Left anterior descending (LAD): The vessel has heavy calcification in the mid vessel.  There is likely diffuse 30-40% plaque throughout the mid LAD in an area of extensive, heavy, segmental coronary calcification.  The diagonal has about 30-40% segmental proximal irregularity, non focal.  The distal LAD has mild diffuse luminal irregularity that is non obstructive.  The apical vessel supplies significant distal inferior myocardium.    Left circumflex (LCx): This a dominant vessel with proximal calcification.  The ramus branch has mild proximal irregularity with 40% segmental plaque proximally.  There is then a large marginal, smaller PLA, and PDA which are without focal  narrowing.    Right coronary artery (RCA): This is non dominant vessel with a smooth 50-60% proximal narrowing after IC TNG  Left ventriculography: Left ventricular systolic function is normal, LVEF is estimated at 55-65%, there is no significant mitral regurgitation   Final Conclusions:   1.  Diffuse coronary calcification without critical focal CAD 2.  Moderate diffuse plaquing of the coronaries as noted. 3.  Preserved overall LV function.  Recommendations:  1.  Heavy focus on lifestyle modification 2.  Add low dose beta blockade to regimen 3.  Follow up in [facility] clinic  [facility] 05/02/2012, 12:22 PM

## 2012-05-04 NOTE — Progress Notes (Signed)
Utilization review completed.  

## 2012-05-04 NOTE — Discharge Summary (Signed)
Patient discharged with plans for follow up with Dr. Eden Emms in the [facility] office.  Discussed with PN, and reviewed with patient.  TS

## 2012-05-05 ENCOUNTER — Ambulatory Visit: Payer: PRIVATE HEALTH INSURANCE | Admitting: Cardiovascular Disease

## 2012-05-05 ENCOUNTER — Encounter: Payer: Self-pay | Admitting: Cardiovascular Disease

## 2012-05-05 ENCOUNTER — Ambulatory Visit (INDEPENDENT_AMBULATORY_CARE_PROVIDER_SITE_OTHER): Payer: PRIVATE HEALTH INSURANCE | Admitting: Cardiovascular Disease

## 2012-05-05 VITALS — BP 144/80 | HR 67 | Ht 63.0 in | Wt 191.1 lb

## 2012-05-05 DIAGNOSIS — E119 Type 2 diabetes mellitus without complications: Secondary | ICD-10-CM | POA: Insufficient documentation

## 2012-05-05 DIAGNOSIS — R079 Chest pain, unspecified: Secondary | ICD-10-CM

## 2012-05-05 DIAGNOSIS — E782 Mixed hyperlipidemia: Secondary | ICD-10-CM | POA: Insufficient documentation

## 2012-05-05 MED ORDER — NITROGLYCERIN 0.4 MG SL SUBL: 0.4000 mg | SUBLINGUAL_TABLET | SUBLINGUAL | Status: DC | PRN

## 2012-05-05 NOTE — Progress Notes (Signed)
Patient ID: Brenda Yoder, female   DOB: [birth year], 66 y.o.   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  Long time DM, HTN, elevated lipids.  Had SSCP.  Cath showed calcified arteries but no significant hemodynamic lesions.  Beta blocker added.  Recent labs 8/6 with A1c 6.8 and LDL 83  Normal LFT's.  Was under some stress with husband in [place].  Doing a lot of yard work and working nights.  Had jaw pain that radiated to chest.  Chest always feels funny from bilateral mastectomies.    Reviewed cath films with Dr Riley Kill  ROS: Denies fever, malais, weight loss, blurry vision, decreased visual acuity, cough, sputum, SOB, hemoptysis, pleuritic pain, palpitaitons, heartburn, abdominal pain, melena, lower extremity edema, claudication, or rash.  All other systems reviewed and negative  General: Affect appropriate Overweight white female HEENT: normal Neck supple with no adenopathy JVP normal no bruits no thyromegaly Lungs clear with no wheezing and good diaphragmatic motion Heart:  S1/S2 no murmur, no rub, gallop or click PMI normal S/P bilateral mastectomies Abdomen: benighn, BS positve, no tenderness, no AAA no bruit.  No HSM or HJR Distal pulses intact with no bruits No edema Neuro non-focal Skin warm and dry No muscular weakness   Current Outpatient Prescriptions  Medication Sig Dispense Refill  . aspirin EC 81 MG tablet Take 81 mg by mouth daily.      Marland Kitchen atorvastatin (LIPITOR) 10 MG tablet Take 10 mg by mouth daily.      . DULoxetine (CYMBALTA) 30 MG capsule Take 30 mg by mouth daily.      . famotidine (PEPCID) 20 MG tablet Take 20 mg by mouth 2 (two) times daily.      Marland Kitchen lisinopril-hydrochlorothiazide (PRINZIDE,ZESTORETIC) 20-12.5 MG per tablet Take 1 tablet by mouth daily.      . metFORMIN (GLUCOPHAGE) 1000 MG tablet Take 1 tablet (1,000 mg total) by mouth 2 (two) times daily with a meal. Please hold for 48hrs after cardiac catheterization. Resume on  05/04/12      . metoprolol (LOPRESSOR) 25 MG tablet Take 1 tablet (25 mg total) by mouth 2 (two) times daily.  60 tablet  6  . sitaGLIPtin (JANUVIA) 100 MG tablet Take 100 mg by mouth daily.      Marland Kitchen zolpidem (AMBIEN) 10 MG tablet Take 10 mg by mouth at bedtime as needed. sleep        Allergies  Review of patient's allergies indicates no known allergies.  Electrocardiogram:  SR rate 82 normal ECG 05/02/12  Assessment and Plan

## 2012-05-05 NOTE — Assessment & Plan Note (Signed)
Cholesterol is at goal.  Continue current dose of statin and diet Rx.  No myalgias or side effects.  F/U  LFT's in 6 months. Lab Results  Component Value Date   LDLCALC 83 05/02/2012

## 2012-05-05 NOTE — Assessment & Plan Note (Signed)
A1c gone form 7.2 to 6.8  Discussed weight watchers and low carb diet  F/U Dr Margo Aye

## 2012-05-05 NOTE — Assessment & Plan Note (Signed)
Likely related more to TMJ and esophageal spasm.  Nitro given to help   Continue aggressive CRF modification.  Perfusion study every 3 years

## 2012-07-12 ENCOUNTER — Other Ambulatory Visit (HOSPITAL_COMMUNITY): Payer: Self-pay | Admitting: Internal Medicine

## 2012-07-12 DIAGNOSIS — Z139 Encounter for screening, unspecified: Secondary | ICD-10-CM

## 2012-07-17 ENCOUNTER — Ambulatory Visit (HOSPITAL_COMMUNITY)
Admission: RE | Admit: 2012-07-17 | Discharge: 2012-07-17 | Disposition: A | Discharge status: Home / Self Care | Payer: PRIVATE HEALTH INSURANCE | Source: Ambulatory Visit | Attending: Internal Medicine | Admitting: Internal Medicine

## 2012-07-17 DIAGNOSIS — Z13820 Encounter for screening for osteoporosis: Secondary | ICD-10-CM | POA: Insufficient documentation

## 2012-07-17 DIAGNOSIS — Z139 Encounter for screening, unspecified: Secondary | ICD-10-CM

## 2012-08-15 ENCOUNTER — Telehealth (INDEPENDENT_AMBULATORY_CARE_PROVIDER_SITE_OTHER): Payer: Self-pay | Admitting: *Deleted

## 2012-08-15 ENCOUNTER — Other Ambulatory Visit (INDEPENDENT_AMBULATORY_CARE_PROVIDER_SITE_OTHER): Payer: Self-pay | Admitting: *Deleted

## 2012-08-15 DIAGNOSIS — Z1211 Encounter for screening for malignant neoplasm of colon: Secondary | ICD-10-CM

## 2012-08-15 DIAGNOSIS — Z86010 Personal history of colonic polyps: Secondary | ICD-10-CM

## 2012-08-15 MED ORDER — PEG-KCL-NACL-NASULF-NA ASC-C 100 G PO SOLR: 1.0000 | Freq: Once | ORAL | Status: DC

## 2012-08-15 NOTE — Telephone Encounter (Signed)
Patient needs movi prep 

## 2012-09-29 ENCOUNTER — Telehealth (INDEPENDENT_AMBULATORY_CARE_PROVIDER_SITE_OTHER): Payer: Self-pay | Admitting: *Deleted

## 2012-09-29 NOTE — Telephone Encounter (Signed)
  Procedure: tcs  Reason/Indication:  Hx polyps  Has patient had this procedure before?  yes  If so, when, by whom and where?  2001  Is there a family history of colon cancer?  no  Who?  What age when diagnosed?    Is patient diabetic?   yes      Does patient have prosthetic heart valve?  no  Do you have a pacemaker?  no  Has patient had joint replacement within last 12 months?  no  Is patient on Coumadin, Plavix and/or Aspirin? yes  Medications: asa 81 mg daily, metformin 1000 mg bid, januvia 100 mg daily, lisinopril 20/12.5 mg bid, metoprolol 25 mg bid, lipitor 10 mg daily, cumbalta 30 mg bid, pepcid ac 20 mg daily, fish oil 1200 mg bid, calcium 600 mg bid, vit d3 5000 units daily, acyclovir 200 mg 5 times daily, prn, imitrex 20 mg nasal spray prn, ambien 10 mg prn  Allergies: nkda  Medication Adjustment: asa 2 days, 1/2 metformin day before, continue [place]  Procedure date & time: 10/26/12 at 830

## 2012-09-29 NOTE — Telephone Encounter (Signed)
agree

## 2012-10-12 ENCOUNTER — Encounter (HOSPITAL_COMMUNITY): Payer: Self-pay | Admitting: Pharmacy Technician

## 2012-10-26 ENCOUNTER — Encounter (HOSPITAL_COMMUNITY): Payer: Self-pay

## 2012-10-26 ENCOUNTER — Encounter (HOSPITAL_COMMUNITY)
Admission: RE | Disposition: A | Discharge status: Home / Self Care | Payer: Self-pay | Source: Ambulatory Visit | Attending: Internal Medicine

## 2012-10-26 ENCOUNTER — Ambulatory Visit (HOSPITAL_COMMUNITY)
Admission: RE | Admit: 2012-10-26 | Discharge: 2012-10-26 | Disposition: A | Discharge status: Home / Self Care | Payer: PRIVATE HEALTH INSURANCE | Source: Ambulatory Visit | Attending: Internal Medicine | Admitting: Internal Medicine

## 2012-10-26 DIAGNOSIS — Z1211 Encounter for screening for malignant neoplasm of colon: Secondary | ICD-10-CM

## 2012-10-26 DIAGNOSIS — K644 Residual hemorrhoidal skin tags: Secondary | ICD-10-CM | POA: Insufficient documentation

## 2012-10-26 DIAGNOSIS — Z01812 Encounter for preprocedural laboratory examination: Secondary | ICD-10-CM | POA: Insufficient documentation

## 2012-10-26 DIAGNOSIS — D126 Benign neoplasm of colon, unspecified: Secondary | ICD-10-CM | POA: Insufficient documentation

## 2012-10-26 DIAGNOSIS — E119 Type 2 diabetes mellitus without complications: Secondary | ICD-10-CM | POA: Insufficient documentation

## 2012-10-26 DIAGNOSIS — I10 Essential (primary) hypertension: Secondary | ICD-10-CM | POA: Insufficient documentation

## 2012-10-26 DIAGNOSIS — Z860100 Personal history of colon polyps, unspecified: Secondary | ICD-10-CM | POA: Insufficient documentation

## 2012-10-26 HISTORY — PX: COLONOSCOPY: SHX5424

## 2012-10-26 LAB — GLUCOSE, CAPILLARY: Glucose-Capillary: 180 mg/dL — ABNORMAL HIGH (ref 70–99)

## 2012-10-26 SURGERY — COLONOSCOPY
Anesthesia: Moderate Sedation

## 2012-10-26 MED ORDER — MIDAZOLAM HCL 5 MG/5ML IJ SOLN
INTRAMUSCULAR | Status: DC | PRN
  Administered 2012-10-26 (×2): 2 mg via INTRAVENOUS

## 2012-10-26 MED ORDER — MIDAZOLAM HCL 5 MG/5ML IJ SOLN
INTRAMUSCULAR | Status: AC
  Filled 2012-10-26: qty 10

## 2012-10-26 MED ORDER — CEFAZOLIN SODIUM-DEXTROSE 2-3 GM-% IV SOLR
INTRAVENOUS | Status: AC
  Filled 2012-10-26: qty 50

## 2012-10-26 MED ORDER — MEPERIDINE HCL 50 MG/ML IJ SOLN
INTRAMUSCULAR | Status: DC | PRN
  Administered 2012-10-26: 25 mg via INTRAVENOUS

## 2012-10-26 MED ORDER — CEFAZOLIN SODIUM-DEXTROSE 2-3 GM-% IV SOLR
2.0000 g | Freq: Once | INTRAVENOUS | Status: AC
  Administered 2012-10-26: 2 g via INTRAVENOUS

## 2012-10-26 MED ORDER — STERILE WATER FOR IRRIGATION IR SOLN
Status: DC | PRN
  Administered 2012-10-26: 09:00:00

## 2012-10-26 MED ORDER — MIDAZOLAM HCL 5 MG/5ML IJ SOLN
INTRAMUSCULAR | Status: DC | PRN
  Administered 2012-10-26: 2 mg via INTRAVENOUS

## 2012-10-26 MED ORDER — SODIUM CHLORIDE 0.45 % IV SOLN
INTRAVENOUS | Status: DC
  Administered 2012-10-26: 08:00:00 via INTRAVENOUS

## 2012-10-26 MED ORDER — MEPERIDINE HCL 50 MG/ML IJ SOLN
INTRAMUSCULAR | Status: AC
  Filled 2012-10-26: qty 1

## 2012-10-26 NOTE — H&P (Signed)
Brenda Yoder is an 67 y.o. female.   Chief Complaint: Patient is here for colonoscopy. 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 She has history of colonic adenomas. Last exam was 5 years ago. Family history is negative for colorectal carcinoma. She denies abdominal pain change in her bowel habits or rectal bleeding.  Past Medical History  Diagnosis Date  . Headache   . PONV (postoperative nausea and vomiting)     only with bil mastectomy  . Diabetes mellitus     NIDDM  . Hypertension   . Anxiety   . GERD (gastroesophageal reflux disease)     Past Surgical History  Procedure Date  . Tonsillectomy   . Abdominal hysterectomy   . Mastectomy     bil  . Joint replacement     lt knee  . Cholecystectomy   . Mass excision 02/23/2012    Procedure: EXCISION MASS;  Surgeon: Nicki Reaper, MD;  Location: [facility];  Service: Orthopedics;  Laterality: Right;  right forearm    History reviewed. No pertinent family history. Social History:  reports that she quit smoking about 35 years ago. Her smoking use included Cigarettes. She has a 6 pack-year smoking history. She does not have any smokeless tobacco history on file. She reports that she drinks alcohol. She reports that she does not use illicit drugs.  Allergies: No Known Allergies  Medications Prior to Admission  Medication Sig Dispense Refill  . acyclovir (ZOVIRAX) 400 MG tablet Take 400 mg by mouth as needed.      Marland Kitchen aspirin EC 81 MG tablet Take 81 mg by mouth daily.      Marland Kitchen atorvastatin (LIPITOR) 10 MG tablet Take 10 mg by mouth daily.      . Calcium Carb-Vit D-C-E-Mineral (OS-CAL ULTRA) 600 MG TABS Take 600 mg by mouth 2 (two) times daily.      . DULoxetine (CYMBALTA) 30 MG capsule Take 30 mg by mouth daily.      . famotidine (PEPCID) 20 MG tablet Take 20 mg by mouth 2 (two) times daily.      Marland Kitchen lisinopril-hydrochlorothiazide (PRINZIDE,ZESTORETIC) 20-12.5 MG per tablet Take 2 tablets  by mouth daily.       . metFORMIN (GLUCOPHAGE) 1000 MG tablet Take 1 tablet (1,000 mg total) by mouth 2 (two) times daily with a meal. Please hold for 48hrs after cardiac catheterization. Resume on 05/04/12      . metoprolol (LOPRESSOR) 25 MG tablet Take 1 tablet (25 mg total) by mouth 2 (two) times daily.  60 tablet  6  . Omega-3 Fatty Acids (FISH OIL) 1200 MG CAPS Take 1,200 mg by mouth 2 (two) times daily.      . sitaGLIPtin (JANUVIA) 100 MG tablet Take 100 mg by mouth daily.       . SUMAtriptan (IMITREX) 20 MG/ACT nasal spray Place 1 spray into the nose every 2 (two) hours as needed.      . zolpidem (AMBIEN) 10 MG tablet Take 10 mg by mouth at bedtime as needed. sleep      . nitroGLYCERIN (NITROSTAT) 0.4 MG SL tablet Place 1 tablet (0.4 mg total) under the tongue every 5 (five) minutes as needed for chest pain.  90 tablet  3    Results for orders placed during the hospital encounter of 10/26/12 (from the past 48 hour(s))  GLUCOSE, CAPILLARY     Status: Abnormal   Collection Time   10/26/12  7:39 AM  Component Value Range Comment   Glucose-Capillary 180 (*) 70 - 99 mg/dL    No results found.  ROS  Blood pressure 118/61, pulse 64, resp. rate 17, height 5\' 4"  (1.626 m), weight 174 lb (78.926 kg), SpO2 100.00%. Physical Exam  Constitutional: She appears well-developed and well-nourished.  HENT:  Mouth/Throat: Oropharynx is clear and moist.  Eyes: Conjunctivae normal are normal. No scleral icterus.  Neck: No thyromegaly present.  Cardiovascular: Normal rate, regular rhythm and normal heart sounds.   No murmur heard. Respiratory: Effort normal and breath sounds normal.  GI: Soft. She exhibits no distension and no mass. There is no tenderness.  Musculoskeletal: She exhibits no edema.  Lymphadenopathy:    She has no cervical adenopathy.  Neurological: She is alert.  Skin: Skin is warm and dry.     Assessment/Plan History of colonic polyps.  Surveillance  colonoscopy.  Hamid Brookens U 10/26/2012, 8:33 AM

## 2012-10-26 NOTE — Op Note (Signed)
COLONOSCOPY PROCEDURE REPORT  PATIENT:  Brenda Yoder  MR#:  [id number] Birthdate:  02-28-1946, 67 y.o., female Endoscopist:  Dr. Malissa Hippo, MD Referred By:  Dr. Dwana Melena, MD  Procedure Date: 10/26/2012  Procedure:   Colonoscopy and snare polypectomy.  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 She is here for surveillance colonoscopy.  Informed Consent:  The procedure and risks were reviewed with the patient and informed consent was obtained.  Medications:  Demerol 25 mg IV Versed 6 mg IV  Description of procedure:  After a digital rectal exam was performed, that colonoscope was advanced from the anus through the rectum and colon to the area of the cecum, ileocecal valve and appendiceal orifice. The cecum was deeply intubated. These structures were well-seen and photographed for the record. From the level of the cecum and ileocecal valve, the scope was slowly and cautiously withdrawn. The mucosal surfaces were carefully surveyed utilizing scope tip to flexion to facilitate fold flattening as needed. The scope was pulled down into the rectum where a thorough exam including retroflexion was performed.  Findings:   Prep fair. Polyp at appendiceal orifice snared piecemeal(2 pieces) and residual polyp coagulated with snare tip. Small polyp at hepatic flexure was coagulated with snare tip. 8 mm polyp at transverse colon was snared. Small polyp at sigmoid colon was coagulated. Normal rectal mucosa. Small hemorrhoids below the dentate line.  Therapeutic/Diagnostic Maneuvers Performed:  See above  Complications:  None  Cecal Withdrawal Time:  25 minutes  Impression:  Examination performed to cecum. Small polyp at appendiceal orifice snared piecemeal and residual polyp coagulated with snare tip. 8 mm polyp snared from transverse colon. Two small polyps are coagulated(hepatic flexure and sigmoid colon). Small external  hemorrhoids.  Recommendations:  Standard instructions given. I will contact patient with biopsy results and further recommendations.  [name],[name]  10/26/2012 9:25 AM  CC: Dr. Dwana Melena, MD & Dr. Bonnetta Barry ref. provider found

## 2012-10-27 ENCOUNTER — Encounter (HOSPITAL_COMMUNITY): Payer: Self-pay | Admitting: Internal Medicine

## 2012-11-15 ENCOUNTER — Encounter (INDEPENDENT_AMBULATORY_CARE_PROVIDER_SITE_OTHER): Payer: Self-pay | Admitting: *Deleted

## 2014-09-05 ENCOUNTER — Encounter (HOSPITAL_COMMUNITY): Payer: Self-pay | Admitting: Cardiology

## 2015-03-11 ENCOUNTER — Other Ambulatory Visit (HOSPITAL_COMMUNITY): Payer: Self-pay | Admitting: Internal Medicine

## 2015-03-11 DIAGNOSIS — M8580 Other specified disorders of bone density and structure, unspecified site: Secondary | ICD-10-CM

## 2015-03-18 ENCOUNTER — Other Ambulatory Visit (HOSPITAL_COMMUNITY): Payer: PRIVATE HEALTH INSURANCE

## 2015-03-24 ENCOUNTER — Other Ambulatory Visit: Payer: Self-pay

## 2015-03-24 NOTE — Patient Instructions (Signed)
Brenda Yoder  03/24/2015     @PREFPERIOPPHARMACY @   Your procedure is scheduled on 04/11/2015  Report to [facility] : [facility] at 7:00 A.M.  Call this number if you have problems the morning of surgery:  [phone]   Remember:  Do not eat food or drink liquids after midnight.  Take these medicines the morning of surgery with A SIP OF WATER Cymbalta, Pepcid, Lisinopril, Metoprolol, Imitrex   DO NOT TAKE METFORMIN OR JANUVIA AM OF PROCEDURE   Do not wear jewelry, make-up or nail polish.  Do not wear lotions, powders, or perfumes.  You may wear deodorant.  Do not shave 48 hours prior to surgery.  Men may shave face and neck.  Do not bring valuables to the hospital.  [facility] is not responsible for any belongings or valuables.  Contacts, dentures or bridgework may not be worn into surgery.  Leave your suitcase in the car.  After surgery it may be brought to your room.  For patients admitted to the hospital, discharge time will be determined by your treatment team.  Patients discharged the day of surgery will not be allowed to drive home.    Please read over the following fact sheets that you were given. Anesthesia Post-op Instructions      PATIENT INSTRUCTIONS POST-ANESTHESIA  IMMEDIATELY FOLLOWING SURGERY:  Do not drive or operate machinery for the first twenty four hours after surgery.  Do not make any important decisions for twenty four hours after surgery or while taking narcotic pain medications or sedatives.  If you develop intractable nausea and vomiting or a severe headache please notify your doctor immediately.  FOLLOW-UP:  Please make an appointment with your surgeon as instructed. You do not need to follow up with anesthesia unless specifically instructed to do so.  WOUND CARE INSTRUCTIONS (if applicable):  Keep a dry clean dressing on the anesthesia/puncture wound site if there is drainage.  Once the wound has quit draining you may leave it open to air.  Generally you  should leave the bandage intact for twenty four hours unless there is drainage.  If the epidural site drains for more than 36-48 hours please call the anesthesia department.  QUESTIONS?:  Please feel free to call your physician or the hospital operator if you have any questions, and they will be happy to assist you.      Cataract A cataract is a clouding of the lens of the eye. When a lens becomes cloudy, vision is reduced based on the degree and nature of the clouding. Many cataracts reduce vision to some degree. Some cataracts make people more near-sighted as they develop. Other cataracts increase glare. Cataracts that are ignored and become worse can sometimes look white. The white color can be seen through the pupil. CAUSES   Aging. However, cataracts may occur at any age, even in newborns.  Certain drugs.  Trauma to the eye.  Certain diseases such as diabetes.  Specific eye diseases such as chronic inflammation inside the eye or a sudden attack of a rare form of glaucoma.  Inherited or acquired medical problems. SYMPTOMS   Gradual, progressive drop in vision in the affected eye.  Severe, rapid visual loss. This most often happens when trauma is the cause. DIAGNOSIS  To detect a cataract, an eye doctor examines the lens. Cataracts are best diagnosed with an exam of the eyes with the pupils enlarged (dilated) by drops.  TREATMENT  For an early cataract, vision may improve by using  different eyeglasses or stronger lighting. If that does not help your vision, surgery is the only effective treatment. A cataract needs to be surgically removed when vision loss interferes with your everyday activities, such as driving, reading, or watching TV. A cataract may also have to be removed if it prevents examination or treatment of another eye problem. Surgery removes the cloudy lens and usually replaces it with a substitute lens (intraocular lens, IOL).  At a time when both you and your doctor  agree, the cataract will be surgically removed. If you have cataracts in both eyes, only one is usually removed at a time. This allows the operated eye to heal and be out of danger from any possible problems after surgery (such as infection or poor wound healing). In rare cases, a cataract may be doing damage to your eye. In these cases, your caregiver may advise surgical removal right away. The vast majority of people who have cataract surgery have better vision afterward. HOME CARE INSTRUCTIONS  If you are not planning surgery, you may be asked to do the following:  Use different eyeglasses.  Use stronger or brighter lighting.  Ask your eye doctor about reducing your medicine dose or changing medicines if it is thought that a medicine caused your cataract. Changing medicines does not make the cataract go away on its own.  Become familiar with your surroundings. Poor vision can lead to injury. Avoid bumping into things on the affected side. You are at a higher risk for tripping or falling.  Exercise extreme care when driving or operating machinery.  Wear sunglasses if you are sensitive to bright light or experiencing problems with glare. SEEK IMMEDIATE MEDICAL CARE IF:   You have a worsening or sudden vision loss.  You notice redness, swelling, or increasing pain in the eye.  You have a fever. Document Released: 09/13/2005 Document Revised: 12/06/2011 Document Reviewed: 05/07/2011 [facility] Patient Information 2015 [place], [place]. This information is not intended to replace advice given to you by your health care provider. Make sure you discuss any questions you have with your health care provider.

## 2015-03-25 ENCOUNTER — Encounter (HOSPITAL_COMMUNITY): Payer: Self-pay

## 2015-03-25 ENCOUNTER — Other Ambulatory Visit: Payer: Self-pay

## 2015-03-25 ENCOUNTER — Encounter (HOSPITAL_COMMUNITY)
Admission: RE | Admit: 2015-03-25 | Discharge: 2015-03-25 | Disposition: A | Discharge status: Home / Self Care | Payer: Medicare HMO | Source: Ambulatory Visit | Attending: Ophthalmology | Admitting: Ophthalmology

## 2015-03-25 DIAGNOSIS — Z01818 Encounter for other preprocedural examination: Secondary | ICD-10-CM | POA: Diagnosis present

## 2015-03-25 DIAGNOSIS — H2512 Age-related nuclear cataract, left eye: Secondary | ICD-10-CM | POA: Insufficient documentation

## 2015-03-25 LAB — BASIC METABOLIC PANEL
Anion gap: 10 (ref 5–15)
BUN: 10 mg/dL (ref 6–20)
CHLORIDE: 99 mmol/L — AB (ref 101–111)
CO2: 28 mmol/L (ref 22–32)
CREATININE: 0.77 mg/dL (ref 0.44–1.00)
Calcium: 9.6 mg/dL (ref 8.9–10.3)
Glucose, Bld: 162 mg/dL — ABNORMAL HIGH (ref 65–99)
POTASSIUM: 4.6 mmol/L (ref 3.5–5.1)
Sodium: 137 mmol/L (ref 135–145)

## 2015-03-25 LAB — CBC
HCT: 41 % (ref 36.0–46.0)
Hemoglobin: 13.7 g/dL (ref 12.0–15.0)
MCH: 29.1 pg (ref 26.0–34.0)
MCHC: 33.4 g/dL (ref 30.0–36.0)
MCV: 87.2 fL (ref 78.0–100.0)
PLATELETS: 162 10*3/uL (ref 150–400)
RBC: 4.7 MIL/uL (ref 3.87–5.11)
RDW: 13.2 % (ref 11.5–15.5)
WBC: 6 10*3/uL (ref 4.0–10.5)

## 2015-04-01 ENCOUNTER — Ambulatory Visit (HOSPITAL_COMMUNITY)
Admission: RE | Admit: 2015-04-01 | Discharge: 2015-04-01 | Disposition: A | Discharge status: Home / Self Care | Payer: Medicare HMO | Source: Ambulatory Visit | Attending: Ophthalmology | Admitting: Ophthalmology

## 2015-04-01 ENCOUNTER — Ambulatory Visit (HOSPITAL_COMMUNITY): Payer: Medicare HMO | Admitting: Anesthesiology

## 2015-04-01 ENCOUNTER — Encounter (HOSPITAL_COMMUNITY)
Admission: RE | Disposition: A | Discharge status: Home / Self Care | Payer: Self-pay | Source: Ambulatory Visit | Attending: Ophthalmology

## 2015-04-01 ENCOUNTER — Encounter (HOSPITAL_COMMUNITY): Payer: Self-pay | Admitting: *Deleted

## 2015-04-01 DIAGNOSIS — H2512 Age-related nuclear cataract, left eye: Secondary | ICD-10-CM | POA: Diagnosis not present

## 2015-04-01 DIAGNOSIS — E119 Type 2 diabetes mellitus without complications: Secondary | ICD-10-CM | POA: Diagnosis not present

## 2015-04-01 DIAGNOSIS — K219 Gastro-esophageal reflux disease without esophagitis: Secondary | ICD-10-CM | POA: Insufficient documentation

## 2015-04-01 DIAGNOSIS — Z87891 Personal history of nicotine dependence: Secondary | ICD-10-CM | POA: Insufficient documentation

## 2015-04-01 DIAGNOSIS — I10 Essential (primary) hypertension: Secondary | ICD-10-CM | POA: Insufficient documentation

## 2015-04-01 DIAGNOSIS — Z79899 Other long term (current) drug therapy: Secondary | ICD-10-CM | POA: Insufficient documentation

## 2015-04-01 HISTORY — PX: CATARACT EXTRACTION W/PHACO: SHX586

## 2015-04-01 LAB — GLUCOSE, CAPILLARY: Glucose-Capillary: 195 mg/dL — ABNORMAL HIGH (ref 65–99)

## 2015-04-01 SURGERY — PHACOEMULSIFICATION, CATARACT, WITH IOL INSERTION
Anesthesia: Monitor Anesthesia Care | Laterality: Left

## 2015-04-01 MED ORDER — FENTANYL CITRATE (PF) 100 MCG/2ML IJ SOLN
25.0000 ug | INTRAMUSCULAR | Status: AC
  Administered 2015-04-01 (×2): 25 ug via INTRAVENOUS

## 2015-04-01 MED ORDER — TETRACAINE HCL 0.5 % OP SOLN
1.0000 [drp] | OPHTHALMIC | Status: AC
  Administered 2015-04-01 (×3): 1 [drp] via OPHTHALMIC

## 2015-04-01 MED ORDER — POVIDONE-IODINE 5 % OP SOLN
OPHTHALMIC | Status: DC | PRN
Start: 2015-04-01 — End: 2015-04-01
  Administered 2015-04-01: 1 via OPHTHALMIC

## 2015-04-01 MED ORDER — MIDAZOLAM HCL 2 MG/2ML IJ SOLN
1.0000 mg | INTRAMUSCULAR | Status: DC | PRN
  Administered 2015-04-01: 2 mg via INTRAVENOUS

## 2015-04-01 MED ORDER — LIDOCAINE HCL 3.5 % OP GEL: 1.0000 "application " | Freq: Once | OPHTHALMIC | Status: DC

## 2015-04-01 MED ORDER — PHENYLEPHRINE-KETOROLAC 1-0.3 % IO SOLN
INTRAOCULAR | Status: DC | PRN
  Administered 2015-04-01: 500 mL via OPHTHALMIC

## 2015-04-01 MED ORDER — BSS IO SOLN
INTRAOCULAR | Status: AC
  Filled 2015-04-01: qty 4

## 2015-04-01 MED ORDER — MIDAZOLAM HCL 2 MG/2ML IJ SOLN
INTRAMUSCULAR | Status: AC
  Filled 2015-04-01: qty 2

## 2015-04-01 MED ORDER — FENTANYL CITRATE (PF) 100 MCG/2ML IJ SOLN
INTRAMUSCULAR | Status: AC
  Filled 2015-04-01: qty 2

## 2015-04-01 MED ORDER — ONDANSETRON HCL 4 MG/2ML IJ SOLN
4.0000 mg | Freq: Once | INTRAMUSCULAR | Status: AC
  Administered 2015-04-01: 4 mg via INTRAVENOUS

## 2015-04-01 MED ORDER — ONDANSETRON HCL 4 MG/2ML IJ SOLN: 4.0000 mg | Freq: Once | INTRAMUSCULAR | Status: DC | PRN

## 2015-04-01 MED ORDER — LACTATED RINGERS IV SOLN
INTRAVENOUS | Status: DC
  Administered 2015-04-01: 1000 mL via INTRAVENOUS

## 2015-04-01 MED ORDER — CYCLOPENTOLATE-PHENYLEPHRINE 0.2-1 % OP SOLN
1.0000 [drp] | OPHTHALMIC | Status: AC
  Administered 2015-04-01 (×3): 1 [drp] via OPHTHALMIC

## 2015-04-01 MED ORDER — FENTANYL CITRATE (PF) 100 MCG/2ML IJ SOLN: 25.0000 ug | INTRAMUSCULAR | Status: DC | PRN

## 2015-04-01 MED ORDER — LIDOCAINE HCL 3.5 % OP GEL
OPHTHALMIC | Status: DC | PRN
  Administered 2015-04-01 (×2): 1 via OPHTHALMIC

## 2015-04-01 MED ORDER — ONDANSETRON HCL 4 MG/2ML IJ SOLN
INTRAMUSCULAR | Status: AC
  Filled 2015-04-01: qty 2

## 2015-04-01 MED ORDER — BSS IO SOLN
INTRAOCULAR | Status: DC | PRN
  Administered 2015-04-01: 15 mL

## 2015-04-01 MED ORDER — NA HYALUR & NA CHOND-NA HYALUR 0.55-0.5 ML IO KIT
PACK | INTRAOCULAR | Status: DC | PRN
  Administered 2015-04-01: 1 via OPHTHALMIC

## 2015-04-01 SURGICAL SUPPLY — 28 items
CAPSULAR TENSION RING-AMO (OPHTHALMIC RELATED) IMPLANT
CLOTH BEACON ORANGE TIMEOUT ST (SAFETY) ×1 IMPLANT
GLOVE BIO SURGEON STRL SZ7.5 (GLOVE) IMPLANT
GLOVE BIOGEL M 6.5 STRL (GLOVE) IMPLANT
GLOVE BIOGEL PI IND STRL 6.5 (GLOVE) IMPLANT
GLOVE BIOGEL PI IND STRL 7.0 (GLOVE) IMPLANT
GLOVE BIOGEL PI INDICATOR 6.5 (GLOVE)
GLOVE BIOGEL PI INDICATOR 7.0 (GLOVE) ×2
GLOVE ECLIPSE 6.5 STRL STRAW (GLOVE) IMPLANT
GLOVE ECLIPSE 7.5 STRL STRAW (GLOVE) IMPLANT
GLOVE EXAM NITRILE LRG STRL (GLOVE) IMPLANT
GLOVE EXAM NITRILE MD LF STRL (GLOVE) IMPLANT
GLOVE SKINSENSE NS SZ6.5 (GLOVE)
GLOVE SKINSENSE NS SZ7.0 (GLOVE)
GLOVE SKINSENSE STRL SZ6.5 (GLOVE) IMPLANT
GLOVE SKINSENSE STRL SZ7.0 (GLOVE) IMPLANT
INST SET CATARACT ~~LOC~~ (KITS) ×2 IMPLANT
KIT VITRECTOMY (OPHTHALMIC RELATED) IMPLANT
LENS IOL ACRYSOF IQ TORIC 15.0 ×1 IMPLANT
PAD ARMBOARD 7.5X6 YLW CONV (MISCELLANEOUS) ×1 IMPLANT
PROC W NO LENS (INTRAOCULAR LENS)
PROC W SPEC LENS (INTRAOCULAR LENS) ×2
PROCESS W NO LENS (INTRAOCULAR LENS) IMPLANT
PROCESS W SPEC LENS (INTRAOCULAR LENS) IMPLANT
RETRACTOR IRIS SIGHTPATH (OPHTHALMIC RELATED) IMPLANT
RING MALYGIN (MISCELLANEOUS) IMPLANT
VISCOELASTIC ADDITIONAL (OPHTHALMIC RELATED) IMPLANT
WATER STERILE IRR 250ML POUR (IV SOLUTION) ×1 IMPLANT

## 2015-04-01 NOTE — Brief Op Note (Signed)
04/01/2015  9:22 AM  PATIENT:  Brenda Yoder  69 y.o. female  PRE-OPERATIVE DIAGNOSIS:  nuclear cataract left eye  POST-OPERATIVE DIAGNOSIS:  nuclear cataract left eye  PROCEDURE:  Procedure(s): CATARACT EXTRACTION PHACO AND INTRAOCULAR LENS PLACEMENT (IOC)  SURGEON:  Surgeon(s): Williams Che, MD  ASSISTANTS: Bonney Roussel, CST   ANESTHESIA STAFF: Anesthesiologist: Lerry Liner, MD CRNA: Ollen Bowl, CRNA  ANESTHESIA:   topical and MAC  REQUESTED LENS POWER: 23.0  LENS IMPLANT INFORMATION:   Alcon [id number]   S/n [id number]  Exp 11/2019  CUMULATIVE DISSIPATED ENERGY:1.11  INDICATIONS:see scanned office H&P  OP FINDINGS:mod dense ns/cortical  Lens orientation verified by staff @ 90 deg.  [id number]  DICTATION #: none  PLAN OF CARE:   As above  PATIENT DISPOSITION:  Short Stay

## 2015-04-01 NOTE — Transfer of Care (Signed)
Immediate Anesthesia Transfer of Care Note  Patient: Brenda Yoder  Procedure(s) Performed: Procedure(s) with comments: CATARACT EXTRACTION PHACO AND INTRAOCULAR LENS PLACEMENT (IOC) (Left) - CDE 1.11  Patient Location: Short Stay  Anesthesia Type:MAC  Level of Consciousness: awake  Airway & Oxygen Therapy: Patient Spontanous Breathing  Post-op Assessment: Report given to RN  Post vital signs: Reviewed  Last Vitals:  Filed Vitals:   04/01/15 0825  BP: 133/74  Pulse:   Temp:   Resp: 20    Complications: No apparent anesthesia complications

## 2015-04-01 NOTE — Discharge Instructions (Signed)
Brenda Yoder 04/01/2015 Dr. Iona Hansen Post operative Instructions for Cataract Patients  These instructions are for Brenda Yoder and pertain to the operative eye.  1.  Resume your normal diet and previous oral medicines.  2. Your Follow-up appointment is at Dr. Iona Hansen' office in [place] on 04/02/15 at 1115.  3. You may leave the hospital when your driver is present and your nurse releases you.  4. Begin Pred Forte (prednisolone acetate 1%), Acular LS (ketorolac tromethamine .4%) and Gatifloxacin 0.5% eye drops; 1 drop each 4 times daily to operative eye. Begin 3 hours after discharge from Short Stay Unit.  Moxifloxacin 0.5% may be substituted for Gatifloxacin using the same instructions.  84. Page Dr. Iona Hansen via beeper [phone] for significant pain in or around operative eye that is not relieved by Tylenol.  6. If you took Plavix before surgery, restart it at the usual dose on the evening of surgery.  7. Wear dark glasses as necessary for excessive light sensitivity.  8. Do no forcefully rub you your operative eye.  9. Keep your operative eye dry for 1 week. You may gently clean your eyelids with a damp washcloth.  10. You may resume normal occupational activities in one week and resume driving as tolerated after the first post operative visit.  11. It is normal to have blurred vision and a scratchy sensation following surgery.  Dr. Iona Hansen: [phone]  Monitored Anesthesia Care Monitored anesthesia care is an anesthesia service for a medical procedure. Anesthesia is the loss of the ability to feel pain. It is produced by medicines called anesthetics. It may affect a small area of your body (local anesthesia), a large area of your body (regional anesthesia), or your entire body (general anesthesia). The need for monitored anesthesia care depends your procedure, your condition, and the potential need for regional or general anesthesia. It is often provided during procedures where:   General  anesthesia may be needed if there are complications. This is because you need special care when you are under general anesthesia.   You will be under local or regional anesthesia. This is so that you are able to have higher levels of anesthesia if needed.   You will receive calming medicines (sedatives). This is especially the case if sedatives are given to put you in a semi-conscious state of relaxation (deep sedation). This is because the amount of sedative needed to produce this state can be hard to predict. Too much of a sedative can produce general anesthesia. Monitored anesthesia care is performed by one or more health care providers who have special training in all types of anesthesia. You will need to meet with these health care providers before your procedure. During this meeting, they will ask you about your medical history. They will also give you instructions to follow. (For example, you will need to stop eating and drinking before your procedure. You may also need to stop or change medicines you are taking.) During your procedure, your health care providers will stay with you. They will:   Watch your condition. This includes watching your blood pressure, breathing, and level of pain.   Diagnose and treat problems that occur.   Give medicines if they are needed. These may include calming medicines (sedatives) and anesthetics.   Make sure you are comfortable.  Having monitored anesthesia care does not necessarily mean that you will be under anesthesia. It does mean that your health care providers will be able to manage anesthesia if you need it  or if it occurs. It also means that you will be able to have a different type of anesthesia than you are having if you need it. When your procedure is complete, your health care providers will continue to watch your condition. They will make sure any medicines wear off before you are allowed to go home.  Document Released: 06/09/2005 Document  Revised: 01/28/2014 Document Reviewed: 10/25/2012 [facility] Patient Information 2015 [place], [place]. This information is not intended to replace advice given to you by your health care provider. Make sure you discuss any questions you have with your health care provider.

## 2015-04-01 NOTE — Anesthesia Preprocedure Evaluation (Signed)
Anesthesia Evaluation  Patient identified by MRN, date of birth, ID band Patient awake    Reviewed: Allergy & Precautions, NPO status , Patient's Chart, lab work & pertinent test results, reviewed documented beta blocker date and time   History of Anesthesia Complications (+) PONV and history of anesthetic complications  Airway Mallampati: II  TM Distance: >3 FB     Dental  (+) Teeth Intact   Pulmonary former smoker,  breath sounds clear to auscultation        Cardiovascular hypertension, Pt. on medications and Pt. on home beta blockers Rhythm:Regular Rate:Normal     Neuro/Psych  Headaches, Anxiety    GI/Hepatic GERD-  Medicated and Controlled,  Endo/Other  diabetes, Type 2, Oral Hypoglycemic Agents  Renal/GU      Musculoskeletal   Abdominal   Peds  Hematology   Anesthesia Other Findings   Reproductive/Obstetrics                             Anesthesia Physical Anesthesia Plan  ASA: II  Anesthesia Plan: MAC   Post-op Pain Management:    Induction: Intravenous  Airway Management Planned: Nasal Cannula  Additional Equipment:   Intra-op Plan:   Post-operative Plan:   Informed Consent: I have reviewed the patients History and Physical, chart, labs and discussed the procedure including the risks, benefits and alternatives for the proposed anesthesia with the patient or authorized representative who has indicated his/her understanding and acceptance.     Plan Discussed with:   Anesthesia Plan Comments:         Anesthesia Quick Evaluation

## 2015-04-01 NOTE — H&P (Signed)
I have reviewed the pre printed H&P, the patient was re-examined, and I have identified no significant interval changes in the patient's medical condition.  There is no change in the plan of care since the history and physical of record. 

## 2015-04-01 NOTE — Addendum Note (Signed)
Addendum  created 04/01/15 0935 by Ollen Bowl, CRNA   Modules edited: Anesthesia Flowsheet

## 2015-04-01 NOTE — Anesthesia Postprocedure Evaluation (Signed)
  Anesthesia Post-op Note  Patient: Brenda Yoder  Procedure(s) Performed: Procedure(s) with comments: CATARACT EXTRACTION PHACO AND INTRAOCULAR LENS PLACEMENT (IOC) (Left) - CDE 1.11  Patient Location: Short Stay  Anesthesia Type:MAC  Level of Consciousness: awake, alert  and oriented  Airway and Oxygen Therapy: Patient Spontanous Breathing  Post-op Pain: none  Post-op Assessment: Post-op Vital signs reviewed, Patient's Cardiovascular Status Stable, Respiratory Function Stable, Patent Airway and No signs of Nausea or vomiting              Post-op Vital Signs: Reviewed and stable  Last Vitals:  Filed Vitals:   04/01/15 0825  BP: 133/74  Pulse:   Temp:   Resp: 20    Complications: No apparent anesthesia complications

## 2015-04-01 NOTE — Op Note (Signed)
04/01/2015  9:22 AM  PATIENT:  Brenda Yoder  69 y.o. female  PRE-OPERATIVE DIAGNOSIS:  nuclear cataract left eye  POST-OPERATIVE DIAGNOSIS:  nuclear cataract left eye  PROCEDURE:  Procedure(s): CATARACT EXTRACTION PHACO AND INTRAOCULAR LENS PLACEMENT (IOC)  SURGEON:  Surgeon(s): Williams Che, MD  ASSISTANTS: Bonney Roussel, CST   ANESTHESIA STAFF: Anesthesiologist: Lerry Liner, MD CRNA: Ollen Bowl, CRNA  ANESTHESIA:   topical and MAC  REQUESTED LENS POWER: 23.0  LENS IMPLANT INFORMATION:   Alcon [id number]   S/n [id number]  Exp 11/2019  CUMULATIVE DISSIPATED ENERGY:1.11  INDICATIONS:see scanned office H&P  OP FINDINGS:mod dense ns/cortical  Lens orientation verified by staff @ 90 deg.  COMPLICATIONS:None  PATIENT DISPOSITION:  Short Stay  The patient was brought to the operating room in good condition.  The operative eye was prepped and draped in the usual fashion for intraocular surgery.  Lidocaine gel was dropped onto the eye. The limbus ws marked @90  degrees with an 18 ga needle.   A 2.4 mm 10 O'clock near clear corneal stepped incision and a 12 O'clock stab incision were created.  Viscoat was instilled into the anterior chamber.  The 5 mm anterior capsulorhexis was performed with a bent needle cystotome and Utrata forceps.  The lens was hydrodissected and hydrodelineated with a cannula and balanced salt solution and rotated with a Kuglen hook.  Phacoemulsification was perfomed in the divide and conquer technique.  The remaining cortex was removed with I&A and the capsular surfaces polished as necessary.  Provisc was placed into the capsular bag and the lens inserted with the Alcon inserter.  The viscoelastic was removed with I&A and the lens "rocked" into position.  The wounds were hydrated and te anterior chamber was refilled with balanced salt solution. The lens orientation was verified by staff.   The wounds were checked for leakage and rehydrated as necessary.   The lid speculum and drapes were removed and the patient was transported to short stay in good condition.

## 2015-04-02 ENCOUNTER — Encounter (HOSPITAL_COMMUNITY): Payer: Self-pay | Admitting: Ophthalmology

## 2015-04-03 ENCOUNTER — Ambulatory Visit (HOSPITAL_COMMUNITY)
Admission: RE | Admit: 2015-04-03 | Discharge: 2015-04-03 | Disposition: A | Discharge status: Home / Self Care | Payer: Medicare HMO | Source: Ambulatory Visit | Attending: Internal Medicine | Admitting: Internal Medicine

## 2015-04-03 DIAGNOSIS — M8580 Other specified disorders of bone density and structure, unspecified site: Secondary | ICD-10-CM | POA: Insufficient documentation

## 2015-04-03 DIAGNOSIS — Z853 Personal history of malignant neoplasm of breast: Secondary | ICD-10-CM | POA: Insufficient documentation

## 2015-04-03 DIAGNOSIS — M899 Disorder of bone, unspecified: Secondary | ICD-10-CM | POA: Diagnosis present

## 2015-04-03 DIAGNOSIS — Z780 Asymptomatic menopausal state: Secondary | ICD-10-CM | POA: Insufficient documentation

## 2015-04-14 ENCOUNTER — Encounter: Payer: Self-pay | Admitting: Genetic Counselor

## 2015-08-20 DIAGNOSIS — E119 Type 2 diabetes mellitus without complications: Secondary | ICD-10-CM | POA: Diagnosis not present

## 2015-08-20 DIAGNOSIS — E785 Hyperlipidemia, unspecified: Secondary | ICD-10-CM | POA: Diagnosis not present

## 2015-08-26 DIAGNOSIS — M816 Localized osteoporosis [Lequesne]: Secondary | ICD-10-CM | POA: Diagnosis not present

## 2015-08-26 DIAGNOSIS — R945 Abnormal results of liver function studies: Secondary | ICD-10-CM | POA: Diagnosis not present

## 2015-08-26 DIAGNOSIS — E782 Mixed hyperlipidemia: Secondary | ICD-10-CM | POA: Diagnosis not present

## 2015-08-26 DIAGNOSIS — D696 Thrombocytopenia, unspecified: Secondary | ICD-10-CM | POA: Diagnosis not present

## 2015-08-26 DIAGNOSIS — I10 Essential (primary) hypertension: Secondary | ICD-10-CM | POA: Diagnosis not present

## 2015-08-26 DIAGNOSIS — E119 Type 2 diabetes mellitus without complications: Secondary | ICD-10-CM | POA: Diagnosis not present

## 2015-11-25 DIAGNOSIS — E119 Type 2 diabetes mellitus without complications: Secondary | ICD-10-CM | POA: Diagnosis not present

## 2015-11-25 DIAGNOSIS — I10 Essential (primary) hypertension: Secondary | ICD-10-CM | POA: Diagnosis not present

## 2015-11-25 DIAGNOSIS — M791 Myalgia: Secondary | ICD-10-CM | POA: Diagnosis not present

## 2015-11-25 DIAGNOSIS — E782 Mixed hyperlipidemia: Secondary | ICD-10-CM | POA: Diagnosis not present

## 2016-01-02 DIAGNOSIS — E119 Type 2 diabetes mellitus without complications: Secondary | ICD-10-CM | POA: Diagnosis not present

## 2016-01-02 DIAGNOSIS — E782 Mixed hyperlipidemia: Secondary | ICD-10-CM | POA: Diagnosis not present

## 2016-01-05 DIAGNOSIS — M609 Myositis, unspecified: Secondary | ICD-10-CM | POA: Diagnosis not present

## 2016-01-05 DIAGNOSIS — I10 Essential (primary) hypertension: Secondary | ICD-10-CM | POA: Diagnosis not present

## 2016-01-05 DIAGNOSIS — E119 Type 2 diabetes mellitus without complications: Secondary | ICD-10-CM | POA: Diagnosis not present

## 2016-01-05 DIAGNOSIS — M791 Myalgia: Secondary | ICD-10-CM | POA: Diagnosis not present

## 2016-03-24 ENCOUNTER — Ambulatory Visit (HOSPITAL_COMMUNITY)
Admission: RE | Admit: 2016-03-24 | Discharge: 2016-03-24 | Disposition: A | Discharge status: Home / Self Care | Payer: Medicare HMO | Source: Ambulatory Visit | Attending: Internal Medicine | Admitting: Internal Medicine

## 2016-03-24 ENCOUNTER — Other Ambulatory Visit (HOSPITAL_COMMUNITY): Payer: Self-pay | Admitting: Internal Medicine

## 2016-03-24 DIAGNOSIS — M5136 Other intervertebral disc degeneration, lumbar region: Secondary | ICD-10-CM | POA: Insufficient documentation

## 2016-03-24 DIAGNOSIS — M5440 Lumbago with sciatica, unspecified side: Secondary | ICD-10-CM | POA: Diagnosis not present

## 2016-03-24 DIAGNOSIS — M47816 Spondylosis without myelopathy or radiculopathy, lumbar region: Secondary | ICD-10-CM | POA: Diagnosis not present

## 2016-03-24 DIAGNOSIS — Z9889 Other specified postprocedural states: Secondary | ICD-10-CM | POA: Insufficient documentation

## 2016-03-24 DIAGNOSIS — M549 Dorsalgia, unspecified: Secondary | ICD-10-CM | POA: Diagnosis not present

## 2016-04-20 ENCOUNTER — Encounter (HOSPITAL_COMMUNITY): Payer: Self-pay

## 2016-04-22 ENCOUNTER — Ambulatory Visit (HOSPITAL_COMMUNITY): Payer: Medicare HMO | Attending: Internal Medicine | Admitting: Physical Therapy

## 2016-04-22 DIAGNOSIS — M6281 Muscle weakness (generalized): Secondary | ICD-10-CM | POA: Insufficient documentation

## 2016-04-22 DIAGNOSIS — R262 Difficulty in walking, not elsewhere classified: Secondary | ICD-10-CM | POA: Insufficient documentation

## 2016-04-22 DIAGNOSIS — M5442 Lumbago with sciatica, left side: Secondary | ICD-10-CM | POA: Diagnosis not present

## 2016-04-22 DIAGNOSIS — M5441 Lumbago with sciatica, right side: Secondary | ICD-10-CM | POA: Diagnosis not present

## 2016-04-22 NOTE — Therapy (Signed)
[place], [place], [address] Phone: [phone]   Fax:  [phone]  Physical Therapy Evaluation  Patient Details  Name: Brenda Yoder MRN: [id number] Date of Birth: 1945-11-04 Referring Provider: Dr. Delphina Cahill  Encounter Date: 04/22/2016      PT End of Session - 04/22/16 1617    Visit Number 1   Number of Visits 8   Date for PT Re-Evaluation 05/22/16   Authorization Type medcost   Authorization - Visit Number 1   Authorization - Number of Visits 8   PT Start Time 1440   PT Stop Time 1520   PT Time Calculation (min) 40 min   Activity Tolerance Patient tolerated treatment well   Behavior During Therapy [facility] for tasks assessed/performed      Past Medical History:  Diagnosis Date  . Anxiety   . Diabetes mellitus    NIDDM  . GERD (gastroesophageal reflux disease)   . Headache(784.0)   . Hypertension   . PONV (postoperative nausea and vomiting)    only with bil mastectomy    Past Surgical History:  Procedure Laterality Date  . ABDOMINAL HYSTERECTOMY    . CATARACT EXTRACTION W/PHACO Left 04/01/2015   Procedure: CATARACT EXTRACTION PHACO AND INTRAOCULAR LENS PLACEMENT (IOC);  Surgeon: Williams Che, MD;  [facility];  Service: Ophthalmology;  Laterality: Left;  CDE 1.11  . CHOLECYSTECTOMY    . COLONOSCOPY  10/26/2012   Procedure: COLONOSCOPY;  Surgeon: Rogene Houston, MD;  [facility];  Service: Endoscopy;  Laterality: N/A;  830  . JOINT REPLACEMENT     lt knee  . LEFT HEART CATHETERIZATION WITH CORONARY ANGIOGRAM N/A 05/02/2012   Procedure: LEFT HEART CATHETERIZATION WITH CORONARY ANGIOGRAM;  Surgeon: Hillary Bow, MD;  Location: [facility] CATH LAB;  Service: Cardiovascular;  Laterality: N/A;  . MASS EXCISION  02/23/2012   Procedure: EXCISION MASS;  Surgeon: Wynonia Sours, MD;  Location: [place];  Service: Orthopedics;  Laterality: Right;  right forearm  . MASTECTOMY      bil  . TONSILLECTOMY      There were no vitals filed for this visit.       Subjective Assessment - 04/22/16 1448    Subjective Dr. Olin Hauser states that she has had back pain on and off for years but for the past six months the pain has increased to where she has to limit her stability.  She states that she has also been having sciatica down both of her LE to the ankle area.  She states that about every day that she wakes up she has back pain.  She has noticed that she has been having to use stronger pain medication and is using it more frequently.  CT is positive for multilevel DJD with most significant at L23.     Pertinent History Lt TKR, Rt DJD, DM, HTN, breast cancer   Limitations Lifting;Standing;Walking;House hold activities   How long can you sit comfortably? no problem    How long can you stand comfortably? 10 to 30 minutes depending    How long can you walk comfortably? 10-30 minutes    Patient Stated Goals less pain.     Currently in Pain? Yes   Pain Score 4   worst 9/10 at night; lowest .5/10   Pain Location Back   Pain Orientation Lower   Pain Descriptors / Indicators Radiating;Aching   Pain Type Chronic pain   Pain  Radiating Towards once a week to ankle    Pain Onset More than a month ago   Pain Frequency Constant   Aggravating Factors  weight bearing    Pain Relieving Factors massage   Effect of Pain on Daily Activities increases    Multiple Pain Sites No            [facility] PT Assessment - 04/22/16 0001      Assessment   Medical Diagnosis sciatica Bilaterally   Referring Provider Dr. Delphina Cahill   Onset Date/Surgical Date 11/12/15   Prior Therapy none     Precautions   Precautions None     Restrictions   Weight Bearing Restrictions No     Balance Screen   Has the patient fallen in the past 6 months No   Has the patient had a decrease in activity level because of a fear of falling?  No   Is the patient reluctant to leave their home because of a fear of  falling?  No     Home Ecologist residence   Living Arrangements Alone     Prior Function   Level of [place] Retired   Leisure Therapy dogs, swim, walk      Cognition   Overall Cognitive Status Within Functional Limits for tasks assessed     Posture/Postural Control   Posture/Postural Control Postural limitations   Postural Limitations Rounded Shoulders;Forward head;Decreased lumbar lordosis;Decreased thoracic kyphosis     ROM / Strength   AROM / PROM / Strength AROM;Strength     AROM   AROM Assessment Site Lumbar   Lumbar Extension 15   Lumbar - Right Side Bend 17   Lumbar - Left Side Bend 25     Strength   Strength Assessment Site Hip;Knee;Ankle   Right/Left Hip Right;Left   Right Hip Flexion 5/5   Right Hip Extension 3/5   Right Hip ABduction 4-/5   Left Hip Flexion 5/5   Left Hip Extension 3+/5   Left Hip ABduction 4-/5   Right/Left Knee Right;Left   Right Knee Flexion 5/5   Right Knee Extension 5/5   Left Knee Flexion 5/5   Left Knee Extension 5/5   Right/Left Ankle Right;Left   Right Ankle Dorsiflexion 5/5   Left Ankle Dorsiflexion 4/5                   [facility] Adult PT Treatment/Exercise - 04/22/16 0001      Exercises   Exercises Lumbar     Lumbar Exercises: Stretches   Standing Extension 5 reps     Lumbar Exercises: Supine   Ab Set 5 reps   Heel Slides 10 reps   Bridge 10 reps     Lumbar Exercises: Prone   Straight Leg Raise 5 reps                PT Education - 04/22/16 1617    Education provided Yes   Education Details importance of posture, body mechanics in back care, HEP   Person(s) Educated Patient   Methods Explanation;Verbal cues;Handout   Comprehension Verbalized understanding;Returned demonstration          PT Short Term Goals - 04/22/16 1624      PT SHORT TERM GOAL #1   Title Pt to stand and walk erect   Baseline 20 degree forward flexion    Time  2   Period Weeks   Status New  PT SHORT TERM GOAL #2   Title Pt to be able to verbalize proper body mechanics for bed mobility as functional activites to prevent stress on her low back    Time 2   Period Weeks   Status New     PT SHORT TERM GOAL #3   Title Pt to be able to walk for 30 mintues with back pain no greater than a 2/10    Time 2   Period Weeks     PT SHORT TERM GOAL #4   Title Patient LE strength to be increased 1/2 grade to be able to go up and down steps without difficulty   Time 2   Period Weeks           PT Long Term Goals - Apr 27, 2016 1627      PT LONG TERM GOAL #1   Title Pt to have had no radicular sx for the past week to demonstrate decreased nerve irritation    Time 4   Period Weeks   Status New     PT LONG TERM GOAL #2   Title Pt back pain to be at the most a 1/10 to allow pt to walk for over 60 minutes in comfort   Time 4   Period Weeks   Status New     PT LONG TERM GOAL #3   Title Pt LE strength to be at least a 4+/5 to allow pt to be able to get up off the floor with ease    Time 4   Period Weeks               Plan - 2016-04-27 1618    Clinical Impression Statement Ms. Twersky is a 70 yo female who has had chronic low back pain that has exacerbated in severity over the past several months to the point where now she is having radicular sx.  She has been referred to skilled PT.   Evaluation demonstrates decreased activity tolerance, decreased strength, decreased ROM increased pain, and posture deficits.  Ms. Sedberry will benefit from skilled physical therapy to address these issues and maximize her current functional ability.    Rehab Potential Good   PT Frequency 2x / week   PT Duration 4 weeks   PT Treatment/Interventions ADLs/Self Care Home Management;Electrical Stimulation;Traction;Moist Heat;Ultrasound;Therapeutic activities;Therapeutic exercise;Patient/family education;Manual techniques   PT Next Visit Plan Stress the importance of  ambulating in an upright postion, begin standing stabilization including heel raises and functional squats, as well as stretches including knee to chest and hamstring    PT Home Exercise Plan HEP      Patient will benefit from skilled therapeutic intervention in order to improve the following deficits and impairments:  Decreased activity tolerance, Decreased range of motion, Decreased strength, Difficulty walking, Improper body mechanics, Pain, Postural dysfunction  Visit Diagnosis: Lumbago with sciatica, left side - Plan: PT plan of care cert/re-cert  Lumbago with sciatica, right side - Plan: PT plan of care cert/re-cert  Difficulty in walking, not elsewhere classified - Plan: PT plan of care cert/re-cert  Muscle weakness (generalized) - Plan: PT plan of care cert/re-cert      G-Codes - 04-27-16 1629    Functional Assessment Tool Used radicular pain to ankle; pain when walking 30 minutes   Functional Limitation Mobility: Walking and moving around   Mobility: Walking and Moving Around Current Status (B5102) At least 40 percent but less than 60 percent impaired, limited or restricted   Mobility: Walking and Moving  Around Goal Status [phone]) At least 20 percent but less than 40 percent impaired, limited or restricted       Problem List Patient Active Problem List   Diagnosis Date Noted  . Chest pain 05/05/2012  . Diabetes mellitus, type 2 ([place]) 05/05/2012  . Mixed hyperlipidemia 05/05/2012    Rayetta Humphrey, PT CLT [phone] 04/22/2016, 4:33 PM  [place] [address] [place], [place], [address] Phone: [phone]   Fax:  [phone]  Name: Brenda Yoder MRN: [id number] Date of Birth: 1946-04-30

## 2016-04-22 NOTE — Patient Instructions (Addendum)
Backward Bend (Standing)    Arch backward to make hollow of back deeper. Hold __2__ seconds. Repeat ___5_ times per set. Do __1__ sets per session. Do _4___ sessions per day.  http://orth.exer.us/178   Copyright  VHI. All rights reserved.  Isometric Abdominal    Lying on back with knees bent, tighten stomach by pressing elbows down. Hold _5___ seconds. Repeat __10__ times per set. Do _1___ sets per session. Do ___2_ sessions per day.  http://orth.exer.us/1086   Copyright  VHI. All rights reserved.  Bent Leg Lift (Hook-Lying)    Tighten stomach and slowly raise right leg __2-3__ inches from floor. Keep trunk rigid. Hold __3__ seconds. Repeat __10__ times per set. Do _1___ sets per session. Do _2___ sessions per day.  http://orth.exer.us/1090   Copyright  VHI. All rights reserved.  Bridging    Slowly raise buttocks from floor, keeping stomach tight. Repeat 10____ times per set. Do _1___ sets per session. Do _2___ sessions per day.  http://orth.exer.us/1096   Copyright  VHI. All rights reserved.  Straight Leg Raise (Prone)    Abdomen and head supported, keep left knee locked and raise leg at hip. Avoid arching low back. Repeat ___10_ times per set. Do _1___ sets per session. Do ___2_ sessions per day.  http://orth.exer.us/1112   Copyright  VHI. All rights reserved.

## 2016-04-24 ENCOUNTER — Encounter (HOSPITAL_COMMUNITY): Payer: Self-pay

## 2016-04-27 ENCOUNTER — Ambulatory Visit (HOSPITAL_COMMUNITY): Payer: Medicare HMO | Attending: Internal Medicine | Admitting: Physical Therapy

## 2016-04-27 DIAGNOSIS — M6281 Muscle weakness (generalized): Secondary | ICD-10-CM

## 2016-04-27 DIAGNOSIS — R262 Difficulty in walking, not elsewhere classified: Secondary | ICD-10-CM | POA: Diagnosis not present

## 2016-04-27 DIAGNOSIS — M5441 Lumbago with sciatica, right side: Secondary | ICD-10-CM

## 2016-04-27 DIAGNOSIS — M5442 Lumbago with sciatica, left side: Secondary | ICD-10-CM | POA: Insufficient documentation

## 2016-04-27 NOTE — Therapy (Signed)
[place], [place], [address] Phone: [phone]   Fax:  [phone]  Physical Therapy Treatment  Patient Details  Name: JAQUELINNE SAMPSON MRN: [id number] Date of Birth: Dec 09, 1945 Referring Provider: Dr. Delphina Cahill  Encounter Date: 04/27/2016      PT End of Session - 04/27/16 0858    Visit Number 2   Number of Visits 8   Date for PT Re-Evaluation 05/22/16   Authorization Type medcost   Authorization - Visit Number 2   Authorization - Number of Visits 8   PT Start Time 0820   PT Stop Time 0900   PT Time Calculation (min) 40 min   Activity Tolerance Patient tolerated treatment well   Behavior During Therapy [facility] for tasks assessed/performed      Past Medical History:  Diagnosis Date  . Anxiety   . Diabetes mellitus    NIDDM  . GERD (gastroesophageal reflux disease)   . Headache(784.0)   . Hypertension   . PONV (postoperative nausea and vomiting)    only with bil mastectomy    Past Surgical History:  Procedure Laterality Date  . ABDOMINAL HYSTERECTOMY    . CATARACT EXTRACTION W/PHACO Left 04/01/2015   Procedure: CATARACT EXTRACTION PHACO AND INTRAOCULAR LENS PLACEMENT (IOC);  Surgeon: Williams Che, MD;  [facility];  Service: Ophthalmology;  Laterality: Left;  CDE 1.11  . CHOLECYSTECTOMY    . COLONOSCOPY  10/26/2012   Procedure: COLONOSCOPY;  Surgeon: Rogene Houston, MD;  [facility];  Service: Endoscopy;  Laterality: N/A;  830  . JOINT REPLACEMENT     lt knee  . LEFT HEART CATHETERIZATION WITH CORONARY ANGIOGRAM N/A 05/02/2012   Procedure: LEFT HEART CATHETERIZATION WITH CORONARY ANGIOGRAM;  Surgeon: Hillary Bow, MD;  Location: [facility] CATH LAB;  Service: Cardiovascular;  Laterality: N/A;  . MASS EXCISION  02/23/2012   Procedure: EXCISION MASS;  Surgeon: Wynonia Sours, MD;  Location: [place];  Service: Orthopedics;  Laterality: Right;  right forearm  . MASTECTOMY      bil  . TONSILLECTOMY      There were no vitals filed for this visit.      Subjective Assessment - 04/27/16 1212    Subjective Pt states when she got up this morning her LBP was around 6-7 but once she began moving decreased to 3/10.  States she's been doing her HEP and is a little sore but already noting the benefits and improvements.  Pt reports she would like to strengthen her legs as she feels this is contributing to her back pain.                          [facility] Adult PT Treatment/Exercise - 04/27/16 0001      Lumbar Exercises: Stretches   Active Hamstring Stretch 3 reps;30 seconds   Active Hamstring Stretch Limitations standing 12"   Single Knee to Chest Stretch 3 reps;20 seconds     Lumbar Exercises: Standing   Heel Raises 10 reps   Functional Squats 10 reps     Lumbar Exercises: Supine   Ab Set 10 reps   Heel Slides 10 reps   Bridge 10 reps     Lumbar Exercises: Prone   Straight Leg Raise 10 reps                PT Education - 04/27/16 1202    Education provided Yes  Education Details reviewed initial evaluation and goals, HEP.  Instructed with new therex per POC.   Person(s) Educated Patient   Methods Explanation;Demonstration;Tactile cues;Verbal cues;Handout   Comprehension Verbalized understanding;Returned demonstration;Verbal cues required;Tactile cues required          PT Short Term Goals - 04/22/16 1624      PT SHORT TERM GOAL #1   Title Pt to stand and walk erect   Baseline 20 degree forward flexion    Time 2   Period Weeks   Status New     PT SHORT TERM GOAL #2   Title Pt to be able to verbalize proper body mechanics for bed mobility as functional activites to prevent stress on her low back    Time 2   Period Weeks   Status New     PT SHORT TERM GOAL #3   Title Pt to be able to walk for 30 mintues with back pain no greater than a 2/10    Time 2   Period Weeks     PT SHORT TERM GOAL #4   Title Patient LE strength  to be increased 1/2 grade to be able to go up and down steps without difficulty   Time 2   Period Weeks           PT Long Term Goals - 04/22/16 1627      PT LONG TERM GOAL #1   Title Pt to have had no radicular sx for the past week to demonstrate decreased nerve irritation    Time 4   Period Weeks   Status New     PT LONG TERM GOAL #2   Title Pt back pain to be at the most a 1/10 to allow pt to walk for over 60 minutes in comfort   Time 4   Period Weeks   Status New     PT LONG TERM GOAL #3   Title Pt LE strength to be at least a 4+/5 to allow pt to be able to get up off the floor with ease    Time 4   Period Weeks               Plan - 04/27/16 1204    Clinical Impression Statement Overall improvement reported with HEP given at initial evaluation.  Reviewed HEP with completion in good form.  Advanced with addition of closed chain exercises and hamstring stretch.  Also instructed with single knee to chest to complete prior to getting OOB in the morning, as this is her hardest time.  Pt would eventually like to get out the floor with increased ease too as she spends alot of time with her dogs.    Rehab Potential Good   PT Frequency 2x / week   PT Duration 4 weeks   PT Treatment/Interventions ADLs/Self Care Home Management;Electrical Stimulation;Traction;Moist Heat;Ultrasound;Therapeutic activities;Therapeutic exercise;Patient/family education;Manual techniques   PT Next Visit Plan continue to progress towards goals.  Add LE strengthening exercises next session, i.e. STS, SLR, lunges.    PT Home Exercise Plan HEP      Patient will benefit from skilled therapeutic intervention in order to improve the following deficits and impairments:  Decreased activity tolerance, Decreased range of motion, Decreased strength, Difficulty walking, Improper body mechanics, Pain, Postural dysfunction  Visit Diagnosis: Lumbago with sciatica, left side  Lumbago with sciatica, right  side  Difficulty in walking, not elsewhere classified  Muscle weakness (generalized)     Problem List Patient Active Problem List  Diagnosis Date Noted  . Chest pain 05/05/2012  . Diabetes mellitus, type 2 ([place]) 05/05/2012  . Mixed hyperlipidemia 05/05/2012    Teena Irani, PTA/CLT [phone]  04/27/2016, 12:14 PM  [facility] [address] [place], [place], [address] Phone: [phone]   Fax:  [phone]  Name: NILAM EASTLING MRN: [id number] Date of Birth: December 12, 1945

## 2016-04-29 ENCOUNTER — Ambulatory Visit (HOSPITAL_COMMUNITY): Payer: Medicare HMO

## 2016-04-29 DIAGNOSIS — M5441 Lumbago with sciatica, right side: Secondary | ICD-10-CM | POA: Diagnosis not present

## 2016-04-29 DIAGNOSIS — R262 Difficulty in walking, not elsewhere classified: Secondary | ICD-10-CM | POA: Diagnosis not present

## 2016-04-29 DIAGNOSIS — M5442 Lumbago with sciatica, left side: Secondary | ICD-10-CM | POA: Diagnosis not present

## 2016-04-29 DIAGNOSIS — M6281 Muscle weakness (generalized): Secondary | ICD-10-CM

## 2016-04-29 NOTE — Therapy (Addendum)
[place], [place], [address] Phone: [phone]   Fax:  [phone]  Physical Therapy Treatment  Patient Details  Name: Brenda Yoder MRN: [id number] Date of Birth: October 20, 1945 Referring Provider: Dr. Delphina Cahill  Encounter Date: 04/29/2016      PT End of Session - 04/29/16 1815    Visit Number 3   Number of Visits 8   Date for PT Re-Evaluation 05/22/16   Authorization Type medcost   Authorization - Visit Number 3   Authorization - Number of Visits 8   PT Start Time 8325   PT Stop Time 1814   PT Time Calculation (min) 38 min   Activity Tolerance Patient tolerated treatment well   Behavior During Therapy [facility] for tasks assessed/performed      Past Medical History:  Diagnosis Date  . Anxiety   . Diabetes mellitus    NIDDM  . GERD (gastroesophageal reflux disease)   . Headache(784.0)   . Hypertension   . PONV (postoperative nausea and vomiting)    only with bil mastectomy    Past Surgical History:  Procedure Laterality Date  . ABDOMINAL HYSTERECTOMY    . CATARACT EXTRACTION W/PHACO Left 04/01/2015   Procedure: CATARACT EXTRACTION PHACO AND INTRAOCULAR LENS PLACEMENT (IOC);  Surgeon: Williams Che, MD;  [facility];  Service: Ophthalmology;  Laterality: Left;  CDE 1.11  . CHOLECYSTECTOMY    . COLONOSCOPY  10/26/2012   Procedure: COLONOSCOPY;  Surgeon: Rogene Houston, MD;  [facility];  Service: Endoscopy;  Laterality: N/A;  830  . JOINT REPLACEMENT     lt knee  . LEFT HEART CATHETERIZATION WITH CORONARY ANGIOGRAM N/A 05/02/2012   Procedure: LEFT HEART CATHETERIZATION WITH CORONARY ANGIOGRAM;  Surgeon: Hillary Bow, MD;  Location: [facility] CATH LAB;  Service: Cardiovascular;  Laterality: N/A;  . MASS EXCISION  02/23/2012   Procedure: EXCISION MASS;  Surgeon: Wynonia Sours, MD;  Location: [place];  Service: Orthopedics;  Laterality: Right;  right forearm  . MASTECTOMY      bil  . TONSILLECTOMY      There were no vitals filed for this visit.      Subjective Assessment - 04/29/16 1741    Subjective Pt stated she did a lot of household activities today, pain scale 4/10 in center of lower back.  Reports compliance with HEP daily.     Pertinent History Lt TKR, Rt DJD, DM, HTN    Patient Stated Goals less pain.     Currently in Pain? Yes   Pain Score 4    Pain Location Back   Pain Orientation Lower   Pain Descriptors / Indicators Aching  Toothache   Pain Type Chronic pain   Pain Radiating Towards no radicular symptoms   Pain Onset More than a month ago   Pain Frequency Constant   Aggravating Factors  weight bearing   Pain Relieving Factors massage   Effect of Pain on Daily Activities increased pain with ADLs              [facility] Adult PT Treatment/Exercise - 04/29/16 0001      Lumbar Exercises: Standing   Heel Raises 15 reps   Heel Raises Limitations Toe raises    Functional Squats 15 reps   Forward Lunge 10 reps   Forward Lunge Limitations 6in step   Side Lunge 10 reps  6 in step   Other Standing Lumbar Exercises SLS Rt  14", Lt 16" max of 3     Lumbar Exercises: Seated   Sit to Stand 10 reps   Sit to Stand Limitations Lt foot behind 5 reps, feet in neutral 5x due to increased crepitus Rt knee with WB     Lumbar Exercises: Sidelying   Hip Abduction 10 reps  2 sets     Lumbar Exercises: Prone   Straight Leg Raise 10 reps                  PT Short Term Goals - 04/22/16 1624      PT SHORT TERM GOAL #1   Title Pt to stand and walk erect   Baseline 20 degree forward flexion    Time 2   Period Weeks   Status New     PT SHORT TERM GOAL #2   Title Pt to be able to verbalize proper body mechanics for bed mobility as functional activites to prevent stress on her low back    Time 2   Period Weeks   Status New     PT SHORT TERM GOAL #3   Title Pt to be able to walk for 30 mintues with back pain no greater than a 2/10     Time 2   Period Weeks     PT SHORT TERM GOAL #4   Title Patient LE strength to be increased 1/2 grade to be able to go up and down steps without difficulty   Time 2   Period Weeks           PT Long Term Goals - 04/22/16 1627      PT LONG TERM GOAL #1   Title Pt to have had no radicular sx for the past week to demonstrate decreased nerve irritation    Time 4   Period Weeks   Status New     PT LONG TERM GOAL #2   Title Pt back pain to be at the most a 1/10 to allow pt to walk for over 60 minutes in comfort   Time 4   Period Weeks   Status New     PT LONG TERM GOAL #3   Title Pt LE strength to be at least a 4+/5 to allow pt to be able to get up off the floor with ease    Time 4   Period Weeks               Plan - 04/29/16 1815    Clinical Impression Statement Progressed to additional CKC for LE strengthening and core stabiltiy exercises.  Added STS, lunges and SLS to improve functional strengthening and overall core stability.  Pt able to demonstrate good form with minimal cueing required initially with all exercises.  Pt given additional sidelying ABD and SLS HEP to add at home.  EOS pt reports pain minimal maybe 1/10.     Rehab Potential Good   PT Frequency 2x / week   PT Duration 4 weeks   PT Treatment/Interventions ADLs/Self Care Home Management;Electrical Stimulation;Traction;Moist Heat;Ultrasound;Therapeutic activities;Therapeutic exercise;Patient/family education;Manual techniques   PT Next Visit Plan Continue to progress towards goals.  Resume hamstring stretch next session and addition of postural strengthening next session.  Progress to step training when able.        Patient will benefit from skilled therapeutic intervention in order to improve the following deficits and impairments:  Decreased activity tolerance, Decreased range of motion, Decreased strength, Difficulty walking, Improper body mechanics, Pain, Postural dysfunction  Visit  Diagnosis: Lumbago with sciatica, left side  Lumbago with sciatica, right side  Difficulty in walking, not elsewhere classified  Muscle weakness (generalized)     Problem List Patient Active Problem List   Diagnosis Date Noted  . Chest pain 05/05/2012  . Diabetes mellitus, type 2 ([place]) 05/05/2012  . Mixed hyperlipidemia 05/05/2012   Ihor [place], [place]; [place]  Rayetta Humphrey, PT CLT [phone] 04/29/2016, 6:22 PM  [place], [place], [address] Phone: [phone]   Fax:  [phone]  Name: Brenda Yoder MRN: [id number] Date of Birth: Mar 16, 1946  06/07/2018  PHYSICAL THERAPY DISCHARGE SUMMARY  Visits from Start of Care: 3  Current functional level related to goals / functional outcomes: unknown   Remaining deficits: unknown   Education / Equipment: HEP Plan: Patient agrees to discharge.  Patient goals were not met. Patient is being discharged due to not returning since the last visit.  ?????       Rayetta Humphrey, [place] CLT [phone]

## 2016-04-29 NOTE — Patient Instructions (Signed)
Single Leg Balance: Eyes Open    Stand on right leg with eyes open. Hold as long as possible and repeat 5 reps 1-2 times per day.  http://ggbe.exer.us/5   Copyright  VHI. All rights reserved.   Abduction    Lift leg up toward ceiling. Return.  Repeat 15 times each leg. Do 1-2 sessions per day.  http://gt2.exer.us/386   Copyright  VHI. All rights reserved.

## 2016-05-04 ENCOUNTER — Ambulatory Visit (HOSPITAL_COMMUNITY): Payer: Medicare HMO

## 2016-05-04 ENCOUNTER — Telehealth (HOSPITAL_COMMUNITY): Payer: Self-pay

## 2016-05-04 NOTE — Telephone Encounter (Signed)
cx - said the weather has her messed up... Everything hurts.  I asked was she sure she didn't want to come in and she said she wouldn't be able to do what she is supposed to do.

## 2016-05-06 ENCOUNTER — Telehealth (HOSPITAL_COMMUNITY): Payer: Self-pay | Admitting: Physical Therapy

## 2016-05-06 ENCOUNTER — Ambulatory Visit (HOSPITAL_COMMUNITY): Payer: Medicare HMO | Admitting: Physical Therapy

## 2016-05-06 NOTE — Telephone Encounter (Signed)
She has another apptment today

## 2016-05-11 ENCOUNTER — Telehealth (HOSPITAL_COMMUNITY): Payer: Self-pay

## 2016-05-11 ENCOUNTER — Ambulatory Visit (HOSPITAL_COMMUNITY): Payer: Medicare HMO | Admitting: Physical Therapy

## 2016-05-11 NOTE — Telephone Encounter (Signed)
Pt asked that all appointments be cancelled until she sees Dr. Nevada Crane again

## 2016-05-12 DIAGNOSIS — E782 Mixed hyperlipidemia: Secondary | ICD-10-CM | POA: Diagnosis not present

## 2016-05-12 DIAGNOSIS — E119 Type 2 diabetes mellitus without complications: Secondary | ICD-10-CM | POA: Diagnosis not present

## 2016-05-13 ENCOUNTER — Encounter (HOSPITAL_COMMUNITY): Payer: PRIVATE HEALTH INSURANCE | Admitting: Physical Therapy

## 2016-05-18 ENCOUNTER — Encounter (HOSPITAL_COMMUNITY): Payer: PRIVATE HEALTH INSURANCE

## 2016-05-20 ENCOUNTER — Encounter (HOSPITAL_COMMUNITY): Payer: PRIVATE HEALTH INSURANCE | Admitting: Physical Therapy

## 2016-05-21 DIAGNOSIS — E119 Type 2 diabetes mellitus without complications: Secondary | ICD-10-CM | POA: Diagnosis not present

## 2016-05-21 DIAGNOSIS — D696 Thrombocytopenia, unspecified: Secondary | ICD-10-CM | POA: Diagnosis not present

## 2016-05-21 DIAGNOSIS — M816 Localized osteoporosis [Lequesne]: Secondary | ICD-10-CM | POA: Diagnosis not present

## 2016-05-21 DIAGNOSIS — M5440 Lumbago with sciatica, unspecified side: Secondary | ICD-10-CM | POA: Diagnosis not present

## 2016-05-21 DIAGNOSIS — R945 Abnormal results of liver function studies: Secondary | ICD-10-CM | POA: Diagnosis not present

## 2016-05-21 DIAGNOSIS — I10 Essential (primary) hypertension: Secondary | ICD-10-CM | POA: Diagnosis not present

## 2016-05-21 DIAGNOSIS — E782 Mixed hyperlipidemia: Secondary | ICD-10-CM | POA: Diagnosis not present

## 2016-05-25 ENCOUNTER — Encounter (HOSPITAL_COMMUNITY): Payer: PRIVATE HEALTH INSURANCE | Admitting: Physical Therapy

## 2016-05-27 ENCOUNTER — Encounter (HOSPITAL_COMMUNITY): Payer: PRIVATE HEALTH INSURANCE | Admitting: Physical Therapy

## 2016-06-01 DIAGNOSIS — E119 Type 2 diabetes mellitus without complications: Secondary | ICD-10-CM | POA: Diagnosis not present

## 2016-06-01 DIAGNOSIS — H2511 Age-related nuclear cataract, right eye: Secondary | ICD-10-CM | POA: Diagnosis not present

## 2016-06-01 DIAGNOSIS — H538 Other visual disturbances: Secondary | ICD-10-CM | POA: Diagnosis not present

## 2016-06-08 ENCOUNTER — Other Ambulatory Visit (HOSPITAL_COMMUNITY): Payer: Self-pay | Admitting: Internal Medicine

## 2016-06-08 DIAGNOSIS — M545 Low back pain: Principal | ICD-10-CM

## 2016-06-08 DIAGNOSIS — M5136 Other intervertebral disc degeneration, lumbar region: Secondary | ICD-10-CM

## 2016-06-08 DIAGNOSIS — G8929 Other chronic pain: Secondary | ICD-10-CM

## 2016-06-08 DIAGNOSIS — M51369 Other intervertebral disc degeneration, lumbar region without mention of lumbar back pain or lower extremity pain: Secondary | ICD-10-CM

## 2016-06-10 ENCOUNTER — Ambulatory Visit (HOSPITAL_COMMUNITY)
Admission: RE | Admit: 2016-06-10 | Discharge: 2016-06-10 | Disposition: A | Discharge status: Home / Self Care | Payer: Medicare HMO | Source: Ambulatory Visit | Attending: Internal Medicine | Admitting: Internal Medicine

## 2016-06-10 DIAGNOSIS — Z981 Arthrodesis status: Secondary | ICD-10-CM | POA: Insufficient documentation

## 2016-06-10 DIAGNOSIS — G8929 Other chronic pain: Secondary | ICD-10-CM

## 2016-06-10 DIAGNOSIS — M5136 Other intervertebral disc degeneration, lumbar region: Secondary | ICD-10-CM | POA: Insufficient documentation

## 2016-06-10 DIAGNOSIS — M5126 Other intervertebral disc displacement, lumbar region: Secondary | ICD-10-CM | POA: Diagnosis not present

## 2016-06-10 DIAGNOSIS — M4186 Other forms of scoliosis, lumbar region: Secondary | ICD-10-CM | POA: Diagnosis not present

## 2016-06-10 DIAGNOSIS — M545 Low back pain: Secondary | ICD-10-CM

## 2016-06-17 DIAGNOSIS — H538 Other visual disturbances: Secondary | ICD-10-CM | POA: Diagnosis not present

## 2016-06-17 DIAGNOSIS — H2511 Age-related nuclear cataract, right eye: Secondary | ICD-10-CM | POA: Diagnosis not present

## 2016-06-17 NOTE — Patient Instructions (Signed)
Brenda Yoder  06/17/2016     @PREFPERIOPPHARMACY @   Your procedure is scheduled on 06/21/2016.  Report to [facility] at 7:50 A.M.  Call this number if you have problems the morning of surgery:  [phone]   Remember:  Do not eat food or drink liquids after midnight.  Take these medicines the morning of surgery with A SIP OF WATER Cymbalta, Metoprol, Imitrex   Do not wear jewelry, make-up or nail polish.  Do not wear lotions, powders, or perfumes, or deoderant.  Do not shave 48 hours prior to surgery.  Men may shave face and neck.  Do not bring valuables to the hospital.  [facility] is not responsible for any belongings or valuables.  Contacts, dentures or bridgework may not be worn into surgery.  Leave your suitcase in the car.  After surgery it may be brought to your room.  For patients admitted to the hospital, discharge time will be determined by your treatment team.  Patients discharged the day of surgery will not be allowed to drive home.   Please read over the following fact sheets that you were given. Anesthesia Post-op Instructions     PATIENT INSTRUCTIONS POST-ANESTHESIA  IMMEDIATELY FOLLOWING SURGERY:  Do not drive or operate machinery for the first twenty four hours after surgery.  Do not make any important decisions for twenty four hours after surgery or while taking narcotic pain medications or sedatives.  If you develop intractable nausea and vomiting or a severe headache please notify your doctor immediately.  FOLLOW-UP:  Please make an appointment with your surgeon as instructed. You do not need to follow up with anesthesia unless specifically instructed to do so.  WOUND CARE INSTRUCTIONS (if applicable):  Keep a dry clean dressing on the anesthesia/puncture wound site if there is drainage.  Once the wound has quit draining you may leave it open to air.  Generally you should leave the bandage intact for twenty four hours unless there is drainage.  If the  epidural site drains for more than 36-48 hours please call the anesthesia department.  QUESTIONS?:  Please feel free to call your physician or the hospital operator if you have any questions, and they will be happy to assist you.       A cataract is a clouding of the lens of the eye. When a lens becomes cloudy, vision is reduced based on the degree and nature of the clouding. Surgery may be needed to improve vision. Surgery removes the cloudy lens and usually replaces it with a substitute lens (intraocular lens, IOL). LET YOUR EYE DOCTOR KNOW ABOUT:  Allergies to food or medicine.  Medicines taken including herbs, eye drops, over-the-counter medicines, and creams.  Use of steroids (by mouth or creams).  Previous problems with anesthetics or numbing medicine.  History of bleeding problems or blood clots.  Previous surgery.  Other health problems, including diabetes and kidney problems.  Possibility of pregnancy, if this applies. RISKS AND COMPLICATIONS  Infection.  Inflammation of the eyeball (endophthalmitis) that can spread to both eyes (sympathetic ophthalmia).  Poor wound healing.  If an IOL is inserted, it can later fall out of proper position. This is very uncommon.  Clouding of the part of your eye that holds an IOL in place. This is called an "after-cataract." These are uncommon but easily treated. BEFORE THE PROCEDURE  Do not eat or drink anything except small amounts of water for 8 to 12 before your surgery, or as directed  by your caregiver.  Unless you are told otherwise, continue any eye drops you have been prescribed.  Talk to your primary caregiver about all other medicines that you take (both prescription and nonprescription). In some cases, you may need to stop or change medicines near the time of your surgery. This is most important if you are taking blood-thinning medicine.Do not stop medicines unless you are told to do so.  Arrange for someone to drive  you to and from the procedure.  Do not put contact lenses in either eye on the day of your surgery. PROCEDURE There is more than one method for safely removing a cataract. Your doctor can explain the differences and help determine which is best for you. Phacoemulsification surgery is the most common form of cataract surgery.  An injection is given behind the eye or eye drops are given to make this a painless procedure.  A small cut (incision) is made on the edge of the clear, dome-shaped surface that covers the front of the eye (cornea).  A tiny probe is painlessly inserted into the eye. This device gives off ultrasound waves that soften and break up the cloudy center of the lens. This makes it easier for the cloudy lens to be removed by suction.  An IOL may be implanted.  The normal lens of the eye is covered by a clear capsule. Part of that capsule is intentionally left in the eye to support the IOL.  Your surgeon may or may not use stitches to close the incision. There are other forms of cataract surgery that require a larger incision and stitches to close the eye. This approach is taken in cases where the doctor feels that the cataract cannot be easily removed using phacoemulsification. AFTER THE PROCEDURE  When an IOL is implanted, it does not need care. It becomes a permanent part of your eye and cannot be seen or felt.  Your doctor will schedule follow-up exams to check on your progress.  Review your other medicines with your doctor to see which can be resumed after surgery.  Use eye drops or take medicine as prescribed by your doctor.   This information is not intended to replace advice given to you by your health care provider. Make sure you discuss any questions you have with your health care provider.   Document Released: 09/02/2011 Document Revised: 10/04/2014 Document Reviewed: 09/02/2011 Elsevier Interactive Patient Education [employer].

## 2016-06-18 ENCOUNTER — Encounter (HOSPITAL_COMMUNITY): Payer: Self-pay

## 2016-06-18 ENCOUNTER — Encounter (HOSPITAL_COMMUNITY)
Admission: RE | Admit: 2016-06-18 | Discharge: 2016-06-18 | Disposition: A | Discharge status: Home / Self Care | Payer: Medicare HMO | Source: Ambulatory Visit | Attending: Ophthalmology | Admitting: Ophthalmology

## 2016-06-18 DIAGNOSIS — H269 Unspecified cataract: Secondary | ICD-10-CM | POA: Diagnosis present

## 2016-06-18 DIAGNOSIS — E7800 Pure hypercholesterolemia, unspecified: Secondary | ICD-10-CM | POA: Diagnosis not present

## 2016-06-18 DIAGNOSIS — H2511 Age-related nuclear cataract, right eye: Secondary | ICD-10-CM | POA: Diagnosis not present

## 2016-06-18 DIAGNOSIS — Z7984 Long term (current) use of oral hypoglycemic drugs: Secondary | ICD-10-CM | POA: Diagnosis not present

## 2016-06-18 DIAGNOSIS — M1990 Unspecified osteoarthritis, unspecified site: Secondary | ICD-10-CM | POA: Diagnosis not present

## 2016-06-18 DIAGNOSIS — E119 Type 2 diabetes mellitus without complications: Secondary | ICD-10-CM | POA: Diagnosis not present

## 2016-06-18 DIAGNOSIS — Z79899 Other long term (current) drug therapy: Secondary | ICD-10-CM | POA: Diagnosis not present

## 2016-06-18 DIAGNOSIS — Z87891 Personal history of nicotine dependence: Secondary | ICD-10-CM | POA: Diagnosis not present

## 2016-06-18 DIAGNOSIS — Z7982 Long term (current) use of aspirin: Secondary | ICD-10-CM | POA: Diagnosis not present

## 2016-06-18 DIAGNOSIS — I10 Essential (primary) hypertension: Secondary | ICD-10-CM | POA: Diagnosis not present

## 2016-06-18 DIAGNOSIS — K219 Gastro-esophageal reflux disease without esophagitis: Secondary | ICD-10-CM | POA: Diagnosis not present

## 2016-06-18 HISTORY — DX: Personal history of diseases of the blood and blood-forming organs and certain disorders involving the immune mechanism: Z86.2

## 2016-06-18 NOTE — Pre-Procedure Instructions (Signed)
[facility]. She also had an EKG in June of 2016. Dr Patsey Berthold aware and is ok with using both these results.

## 2016-06-18 NOTE — Pre-Procedure Instructions (Signed)
Patient given information to sign up for my chart at home. 

## 2016-06-21 ENCOUNTER — Ambulatory Visit (HOSPITAL_COMMUNITY): Payer: Medicare HMO | Admitting: Anesthesiology

## 2016-06-21 ENCOUNTER — Encounter (HOSPITAL_COMMUNITY): Payer: Self-pay | Admitting: *Deleted

## 2016-06-21 ENCOUNTER — Ambulatory Visit (HOSPITAL_COMMUNITY)
Admission: RE | Admit: 2016-06-21 | Discharge: 2016-06-21 | Disposition: A | Discharge status: Home / Self Care | Payer: Medicare HMO | Source: Ambulatory Visit | Attending: Ophthalmology | Admitting: Ophthalmology

## 2016-06-21 ENCOUNTER — Encounter (HOSPITAL_COMMUNITY)
Admission: RE | Disposition: A | Discharge status: Home / Self Care | Payer: Self-pay | Source: Ambulatory Visit | Attending: Ophthalmology

## 2016-06-21 DIAGNOSIS — H538 Other visual disturbances: Secondary | ICD-10-CM | POA: Diagnosis not present

## 2016-06-21 DIAGNOSIS — Z87891 Personal history of nicotine dependence: Secondary | ICD-10-CM | POA: Diagnosis not present

## 2016-06-21 DIAGNOSIS — Z7984 Long term (current) use of oral hypoglycemic drugs: Secondary | ICD-10-CM | POA: Insufficient documentation

## 2016-06-21 DIAGNOSIS — H2511 Age-related nuclear cataract, right eye: Secondary | ICD-10-CM | POA: Diagnosis not present

## 2016-06-21 DIAGNOSIS — M1990 Unspecified osteoarthritis, unspecified site: Secondary | ICD-10-CM | POA: Insufficient documentation

## 2016-06-21 DIAGNOSIS — H269 Unspecified cataract: Secondary | ICD-10-CM | POA: Diagnosis not present

## 2016-06-21 DIAGNOSIS — I10 Essential (primary) hypertension: Secondary | ICD-10-CM | POA: Diagnosis not present

## 2016-06-21 DIAGNOSIS — K219 Gastro-esophageal reflux disease without esophagitis: Secondary | ICD-10-CM | POA: Diagnosis not present

## 2016-06-21 DIAGNOSIS — E7800 Pure hypercholesterolemia, unspecified: Secondary | ICD-10-CM | POA: Insufficient documentation

## 2016-06-21 DIAGNOSIS — E119 Type 2 diabetes mellitus without complications: Secondary | ICD-10-CM | POA: Diagnosis not present

## 2016-06-21 DIAGNOSIS — Z79899 Other long term (current) drug therapy: Secondary | ICD-10-CM | POA: Insufficient documentation

## 2016-06-21 DIAGNOSIS — Z7982 Long term (current) use of aspirin: Secondary | ICD-10-CM | POA: Diagnosis not present

## 2016-06-21 HISTORY — PX: CATARACT EXTRACTION W/PHACO: SHX586

## 2016-06-21 LAB — GLUCOSE, CAPILLARY: GLUCOSE-CAPILLARY: 143 mg/dL — AB (ref 65–99)

## 2016-06-21 SURGERY — PHACOEMULSIFICATION, CATARACT, WITH IOL INSERTION
Anesthesia: Monitor Anesthesia Care | Site: Eye | Laterality: Right

## 2016-06-21 MED ORDER — FENTANYL CITRATE (PF) 100 MCG/2ML IJ SOLN
25.0000 ug | INTRAMUSCULAR | Status: AC | PRN
  Administered 2016-06-21 (×2): 25 ug via INTRAVENOUS
  Filled 2016-06-21: qty 2

## 2016-06-21 MED ORDER — MIDAZOLAM HCL 2 MG/2ML IJ SOLN
1.0000 mg | INTRAMUSCULAR | Status: DC | PRN
  Administered 2016-06-21 (×2): 1 mg via INTRAVENOUS
  Filled 2016-06-21: qty 2

## 2016-06-21 MED ORDER — TETRACAINE 0.5 % OP SOLN OPTIME - NO CHARGE
OPHTHALMIC | Status: DC | PRN
  Administered 2016-06-21: 1 [drp] via OPHTHALMIC

## 2016-06-21 MED ORDER — BSS IO SOLN
INTRAOCULAR | Status: DC | PRN
  Administered 2016-06-21: 15 mL via INTRAOCULAR

## 2016-06-21 MED ORDER — CYCLOPENTOLATE-PHENYLEPHRINE 0.2-1 % OP SOLN
OPHTHALMIC | Status: AC
  Filled 2016-06-21: qty 2

## 2016-06-21 MED ORDER — NA HYALUR & NA CHOND-NA HYALUR 0.55-0.5 ML IO KIT
PACK | INTRAOCULAR | Status: DC | PRN
  Administered 2016-06-21: 1 via OPHTHALMIC

## 2016-06-21 MED ORDER — PHENYLEPHRINE-KETOROLAC 1-0.3 % IO SOLN
INTRAOCULAR | Status: DC | PRN
  Administered 2016-06-21: 500 mL via OPHTHALMIC

## 2016-06-21 MED ORDER — TETRACAINE HCL 0.5 % OP SOLN
OPHTHALMIC | Status: AC
  Filled 2016-06-21: qty 4

## 2016-06-21 MED ORDER — LIDOCAINE HCL 3.5 % OP GEL
OPHTHALMIC | Status: DC | PRN
  Administered 2016-06-21: 1 via OPHTHALMIC

## 2016-06-21 MED ORDER — ONDANSETRON HCL 4 MG/2ML IJ SOLN: 4.0000 mg | Freq: Once | INTRAMUSCULAR | Status: DC

## 2016-06-21 MED ORDER — TETRACAINE HCL 0.5 % OP SOLN
1.0000 [drp] | OPHTHALMIC | Status: AC
  Administered 2016-06-21 (×3): 1 [drp] via OPHTHALMIC

## 2016-06-21 MED ORDER — PHENYLEPHRINE-KETOROLAC 1-0.3 % IO SOLN
INTRAOCULAR | Status: AC
  Filled 2016-06-21: qty 4

## 2016-06-21 MED ORDER — LIDOCAINE HCL 3.5 % OP GEL
OPHTHALMIC | Status: AC
  Filled 2016-06-21: qty 1

## 2016-06-21 MED ORDER — LACTATED RINGERS IV SOLN
INTRAVENOUS | Status: DC
  Administered 2016-06-21: 09:00:00 via INTRAVENOUS

## 2016-06-21 MED ORDER — CYCLOPENTOLATE-PHENYLEPHRINE 0.2-1 % OP SOLN
1.0000 [drp] | OPHTHALMIC | Status: AC
  Administered 2016-06-21 (×3): 1 [drp] via OPHTHALMIC

## 2016-06-21 MED ORDER — POVIDONE-IODINE 5 % OP SOLN
OPHTHALMIC | Status: DC | PRN
  Administered 2016-06-21: 1 via OPHTHALMIC

## 2016-06-21 SURGICAL SUPPLY — 20 items
CAPSULAR TENSION RING-AMO (OPHTHALMIC RELATED) IMPLANT
CLOTH BEACON ORANGE TIMEOUT ST (SAFETY) ×1 IMPLANT
GLOVE BIOGEL PI IND STRL 7.0 (GLOVE) IMPLANT
GLOVE BIOGEL PI INDICATOR 7.0 (GLOVE) ×1
GLOVE EXAM NITRILE LRG STRL (GLOVE) IMPLANT
GLOVE EXAM NITRILE MD LF STRL (GLOVE) ×1 IMPLANT
GLOVE SKINSENSE NS SZ6.5 (GLOVE)
GLOVE SKINSENSE STRL SZ6.5 (GLOVE) IMPLANT
INST SET CATARACT ~~LOC~~ (KITS) ×2 IMPLANT
KIT VITRECTOMY (OPHTHALMIC RELATED) IMPLANT
LENS ALC ACRYL/TECN (Ophthalmic Related) ×2 IMPLANT
PAD ARMBOARD 7.5X6 YLW CONV (MISCELLANEOUS) ×1 IMPLANT
PROC W NO LENS (INTRAOCULAR LENS)
PROC W SPEC LENS (INTRAOCULAR LENS)
PROCESS W NO LENS (INTRAOCULAR LENS) IMPLANT
PROCESS W SPEC LENS (INTRAOCULAR LENS) IMPLANT
RETRACTOR IRIS SIGHTPATH (OPHTHALMIC RELATED) IMPLANT
RING MALYGIN (MISCELLANEOUS) IMPLANT
VISCOELASTIC ADDITIONAL (OPHTHALMIC RELATED) IMPLANT
WATER STERILE IRR 250ML POUR (IV SOLUTION) ×1 IMPLANT

## 2016-06-21 NOTE — Op Note (Signed)
06/21/2016  10:13 AM  PATIENT:  Brenda Yoder  70 y.o. female  PRE-OPERATIVE DIAGNOSIS:  nuclear cataract right eye  POST-OPERATIVE DIAGNOSIS:  nuclear cataract right eye  PROCEDURE:  Procedure(s): CATARACT EXTRACTION PHACO AND INTRAOCULAR LENS PLACEMENT ; CDE:  3.90  SURGEON:  Surgeon(s): Williams Che, MD  ASSISTANTS: Bonney Roussel, CST   ANESTHESIA STAFF: Anesthesiologist: Lerry Liner, MD CRNA: Vista Deck, CRNA  ANESTHESIA:   topical and MAC  REQUESTED LENS POWER: 21.0  LENS IMPLANT INFORMATION:   Alcon SN60WF  S/n [id number]  Exp 05/2020  CUMULATIVE DISSIPATED ENERGY:3.90  INDICATIONS:see scanned office H&P for details  OP FINDINGS:heavy cortical spokes  COMPLICATIONS:None  PROCEDURE:  The patient was brought to the operating room in good condition.  The operative eye was prepped and draped in the usual fashion for intraocular surgery.  Lidocaine gel was dropped onto the eye.  A 2.4 mm 10 O'clock near clear corneal stepped incision and a 12 O'clock stab incision were created.  Viscoat was instilled into the anterior chamber.  The 5 mm anterior capsulorhexis was performed with a bent needle cystotome and Utrata forceps.  The lens was hydrodissected and hydrodelineated with a cannula and balanced salt solution and rotated with a Kuglen hook.  Phacoemulsification was perfomed in the divide and conquer technique.  The remaining cortex was removed with I&A and the capsular surfaces polished as necessary.  Provisc was placed into the capsular bag and the lens inserted with the Alcon inserter.  The viscoelastic was removed with I&A and the lens "rocked" into position.  The wounds were hydrated and te anterior chamber was refilled with balanced salt solution.  The wounds were checked for leakage and rehydrated as necessary.  The lid speculum and drapes were removed and the patient was transported to short stay in good condition.  PATIENT DISPOSITION:  Short Stay

## 2016-06-21 NOTE — Anesthesia Postprocedure Evaluation (Signed)
  Anesthesia Post-op Note  Patient: LAURENDA STEPLER  Procedure(s) Performed: Procedure(s) (LRB): CATARACT EXTRACTION PHACO AND INTRAOCULAR LENS PLACEMENT ; CDE:  3.90 (Right)  Patient Location:  Short Stay  Anesthesia Type: MAC  Level of Consciousness: awake  Airway and Oxygen Therapy: Patient Spontanous Breathing  Post-op Pain: none  Post-op Assessment: Post-op Vital signs reviewed, Patient's Cardiovascular Status Stable, Respiratory Function Stable, Patent Airway, No signs of Nausea or vomiting and Pain level controlled  Post-op Vital Signs: Reviewed and stable  Complications: No apparent anesthesia complications Anesthesia Post Note  Patient: YIREH MIRSKY  Procedure(s) Performed: Procedure(s) (LRB): CATARACT EXTRACTION PHACO AND INTRAOCULAR LENS PLACEMENT ; CDE:  3.90 (Right)  Anesthesia Post Evaluation  Last Vitals:  Vitals:   06/21/16 0925 06/21/16 0930  BP: (!) 88/48 (!) 81/48  Pulse:    Resp: 14 13  Temp:      Last Pain:  Vitals:   06/21/16 0838  TempSrc: Oral                 Davonn Flanery

## 2016-06-21 NOTE — Discharge Instructions (Signed)
[name] 06/21/2016 Dr. Iona Hansen Post operative Instructions for Cataract Patients  These instructions are for Brenda Yoder and pertain to the operative eye.  1.  Resume your normal diet and previous oral medicines.  2. Your Follow-up appointment is at Dr. Iona Hansen' office in [place] on 06/23/16/ @ 10:45AM  3. You may leave the hospital when your driver is present and your nurse releases you.  4. Begin Pred Forte (prednisolone acetate 1%), Acular LS (ketorolac tromethamine .4%) and Gatifloxacin 0.5% eye drops; 1 drop each 4 times daily to operative eye.  Moxifloxacin 0.5% may be substituted for Gatifloxacin using the same instructions.  39. Page Dr. Iona Hansen via beeper [phone] for significant pain in or around operative eye that is not relieved by Tylenol.  6. If you took Plavix before surgery, restart it at the usual dose on the evening of surgery.  7. Wear dark glasses as necessary for excessive light sensitivity.  8. Do no forcefully rub you your operative eye.  9. Keep your operative eye dry for 1 week. You may gently clean your eyelids with a damp washcloth.  10. You may resume normal occupational activities in one week and resume driving as tolerated after the first post operative visit.  11. It is normal to have blurred vision and a scratchy sensation following surgery.  Dr. Iona Hansen: [phone]

## 2016-06-21 NOTE — Anesthesia Preprocedure Evaluation (Signed)
Anesthesia Evaluation  Patient identified by MRN, date of birth, ID band Patient awake    Reviewed: Allergy & Precautions, NPO status , Patient's Chart, lab work & pertinent test results, reviewed documented beta blocker date and time   History of Anesthesia Complications (+) PONV and history of anesthetic complications  Airway Mallampati: II  TM Distance: >3 FB     Dental  (+) Teeth Intact   Pulmonary former smoker,  breath sounds clear to auscultation        Cardiovascular hypertension, Pt. on medications and Pt. on home beta blockers Rhythm:Regular Rate:Normal     Neuro/Psych  Headaches, Anxiety    GI/Hepatic GERD-  Medicated and Controlled,  Endo/Other  diabetes, Type 2, Oral Hypoglycemic Agents  Renal/GU      Musculoskeletal   Abdominal   Peds  Hematology   Anesthesia Other Findings   Reproductive/Obstetrics                             Anesthesia Physical Anesthesia Plan  ASA: II  Anesthesia Plan: MAC   Post-op Pain Management:    Induction: Intravenous  Airway Management Planned: Nasal Cannula  Additional Equipment:   Intra-op Plan:   Post-operative Plan:   Informed Consent: I have reviewed the patients History and Physical, chart, labs and discussed the procedure including the risks, benefits and alternatives for the proposed anesthesia with the patient or authorized representative who has indicated his/her understanding and acceptance.     Plan Discussed with:   Anesthesia Plan Comments:         Anesthesia Quick Evaluation  

## 2016-06-21 NOTE — Anesthesia Procedure Notes (Signed)
Procedure Name: MAC Date/Time: 06/21/2016 9:37 AM Performed by: Vista Deck Pre-anesthesia Checklist: Patient identified, Emergency Drugs available, Suction available, Timeout performed and Patient being monitored Patient Re-evaluated:Patient Re-evaluated prior to inductionOxygen Delivery Method: Nasal Cannula

## 2016-06-21 NOTE — H&P (Signed)
I have reviewed the pre printed H&P, the patient was re-examined, and I have identified no significant interval changes in the patient's medical condition.  There is no change in the plan of care since the history and physical of record. 

## 2016-06-21 NOTE — Transfer of Care (Signed)
Immediate Anesthesia Transfer of Care Note  Patient: Brenda Yoder  Procedure(s) Performed: Procedure(s) (LRB): CATARACT EXTRACTION PHACO AND INTRAOCULAR LENS PLACEMENT ; CDE:  3.90 (Right)  Patient Location: Shortstay  Anesthesia Type: MAC  Level of Consciousness: awake  Airway & Oxygen Therapy: Patient Spontanous Breathing   Post-op Assessment: Report given to PACU RN, Post -op Vital signs reviewed and stable and Patient moving all extremities  Post vital signs: Reviewed and stable  Complications: No apparent anesthesia complications

## 2016-06-21 NOTE — Brief Op Note (Signed)
06/21/2016  10:13 AM  PATIENT:  Brenda Yoder  70 y.o. female  PRE-OPERATIVE DIAGNOSIS:  nuclear cataract right eye  POST-OPERATIVE DIAGNOSIS:  nuclear cataract right eye  PROCEDURE:  Procedure(s): CATARACT EXTRACTION PHACO AND INTRAOCULAR LENS PLACEMENT ; CDE:  3.90  SURGEON:  Surgeon(s): Williams Che, MD  ASSISTANTS: Bonney Roussel, CST   ANESTHESIA STAFF: Anesthesiologist: Lerry Liner, MD CRNA: Vista Deck, CRNA  ANESTHESIA:   topical and MAC  REQUESTED LENS POWER: 21.0  LENS IMPLANT INFORMATION:   Alcon SN60WF  S/n [id number]  Exp 05/2020  CUMULATIVE DISSIPATED ENERGY:3.90  INDICATIONS:see scanned office H&P for details  OP FINDINGS:heavy cortical spokes  COMPLICATIONS:None  DICTATION #: none  PLAN OF CARE: as above  PATIENT DISPOSITION:  Short Stay

## 2016-06-24 ENCOUNTER — Encounter (HOSPITAL_COMMUNITY): Payer: Self-pay | Admitting: Ophthalmology

## 2016-06-29 DIAGNOSIS — R69 Illness, unspecified: Secondary | ICD-10-CM | POA: Diagnosis not present

## 2016-09-05 DIAGNOSIS — R69 Illness, unspecified: Secondary | ICD-10-CM | POA: Diagnosis not present

## 2016-10-06 DIAGNOSIS — D509 Iron deficiency anemia, unspecified: Secondary | ICD-10-CM | POA: Diagnosis not present

## 2016-10-06 DIAGNOSIS — E119 Type 2 diabetes mellitus without complications: Secondary | ICD-10-CM | POA: Diagnosis not present

## 2016-10-06 DIAGNOSIS — E782 Mixed hyperlipidemia: Secondary | ICD-10-CM | POA: Diagnosis not present

## 2016-10-08 DIAGNOSIS — I10 Essential (primary) hypertension: Secondary | ICD-10-CM | POA: Diagnosis not present

## 2016-10-08 DIAGNOSIS — D696 Thrombocytopenia, unspecified: Secondary | ICD-10-CM | POA: Diagnosis not present

## 2016-10-08 DIAGNOSIS — M816 Localized osteoporosis [Lequesne]: Secondary | ICD-10-CM | POA: Diagnosis not present

## 2016-10-08 DIAGNOSIS — M8580 Other specified disorders of bone density and structure, unspecified site: Secondary | ICD-10-CM | POA: Diagnosis not present

## 2016-10-08 DIAGNOSIS — M5440 Lumbago with sciatica, unspecified side: Secondary | ICD-10-CM | POA: Diagnosis not present

## 2016-10-08 DIAGNOSIS — E119 Type 2 diabetes mellitus without complications: Secondary | ICD-10-CM | POA: Diagnosis not present

## 2016-10-08 DIAGNOSIS — E782 Mixed hyperlipidemia: Secondary | ICD-10-CM | POA: Diagnosis not present

## 2016-10-08 DIAGNOSIS — Z0000 Encounter for general adult medical examination without abnormal findings: Secondary | ICD-10-CM | POA: Diagnosis not present

## 2016-10-08 DIAGNOSIS — R945 Abnormal results of liver function studies: Secondary | ICD-10-CM | POA: Diagnosis not present

## 2016-11-09 DIAGNOSIS — Z6831 Body mass index (BMI) 31.0-31.9, adult: Secondary | ICD-10-CM | POA: Diagnosis not present

## 2016-11-09 DIAGNOSIS — R05 Cough: Secondary | ICD-10-CM | POA: Diagnosis not present

## 2016-11-09 DIAGNOSIS — J060 Acute laryngopharyngitis: Secondary | ICD-10-CM | POA: Diagnosis not present

## 2016-11-17 DIAGNOSIS — R69 Illness, unspecified: Secondary | ICD-10-CM | POA: Diagnosis not present

## 2017-02-07 DIAGNOSIS — E119 Type 2 diabetes mellitus without complications: Secondary | ICD-10-CM | POA: Diagnosis not present

## 2017-02-07 DIAGNOSIS — I10 Essential (primary) hypertension: Secondary | ICD-10-CM | POA: Diagnosis not present

## 2017-02-09 DIAGNOSIS — M5440 Lumbago with sciatica, unspecified side: Secondary | ICD-10-CM | POA: Diagnosis not present

## 2017-02-09 DIAGNOSIS — M1990 Unspecified osteoarthritis, unspecified site: Secondary | ICD-10-CM | POA: Diagnosis not present

## 2017-02-09 DIAGNOSIS — I10 Essential (primary) hypertension: Secondary | ICD-10-CM | POA: Diagnosis not present

## 2017-02-09 DIAGNOSIS — Z6831 Body mass index (BMI) 31.0-31.9, adult: Secondary | ICD-10-CM | POA: Diagnosis not present

## 2017-02-09 DIAGNOSIS — E782 Mixed hyperlipidemia: Secondary | ICD-10-CM | POA: Diagnosis not present

## 2017-02-09 DIAGNOSIS — R945 Abnormal results of liver function studies: Secondary | ICD-10-CM | POA: Diagnosis not present

## 2017-02-09 DIAGNOSIS — M8580 Other specified disorders of bone density and structure, unspecified site: Secondary | ICD-10-CM | POA: Diagnosis not present

## 2017-02-09 DIAGNOSIS — E119 Type 2 diabetes mellitus without complications: Secondary | ICD-10-CM | POA: Diagnosis not present

## 2017-02-09 DIAGNOSIS — D696 Thrombocytopenia, unspecified: Secondary | ICD-10-CM | POA: Diagnosis not present

## 2017-07-03 DIAGNOSIS — R69 Illness, unspecified: Secondary | ICD-10-CM | POA: Diagnosis not present

## 2017-08-05 DIAGNOSIS — I10 Essential (primary) hypertension: Secondary | ICD-10-CM | POA: Diagnosis not present

## 2017-08-05 DIAGNOSIS — E782 Mixed hyperlipidemia: Secondary | ICD-10-CM | POA: Diagnosis not present

## 2017-08-05 DIAGNOSIS — E119 Type 2 diabetes mellitus without complications: Secondary | ICD-10-CM | POA: Diagnosis not present

## 2017-08-16 DIAGNOSIS — M1990 Unspecified osteoarthritis, unspecified site: Secondary | ICD-10-CM | POA: Diagnosis not present

## 2017-08-16 DIAGNOSIS — M5440 Lumbago with sciatica, unspecified side: Secondary | ICD-10-CM | POA: Diagnosis not present

## 2017-08-16 DIAGNOSIS — M8580 Other specified disorders of bone density and structure, unspecified site: Secondary | ICD-10-CM | POA: Diagnosis not present

## 2017-08-16 DIAGNOSIS — R945 Abnormal results of liver function studies: Secondary | ICD-10-CM | POA: Diagnosis not present

## 2017-08-16 DIAGNOSIS — Z6831 Body mass index (BMI) 31.0-31.9, adult: Secondary | ICD-10-CM | POA: Diagnosis not present

## 2017-08-16 DIAGNOSIS — E119 Type 2 diabetes mellitus without complications: Secondary | ICD-10-CM | POA: Diagnosis not present

## 2017-08-16 DIAGNOSIS — I10 Essential (primary) hypertension: Secondary | ICD-10-CM | POA: Diagnosis not present

## 2017-08-16 DIAGNOSIS — E782 Mixed hyperlipidemia: Secondary | ICD-10-CM | POA: Diagnosis not present

## 2017-08-16 DIAGNOSIS — D696 Thrombocytopenia, unspecified: Secondary | ICD-10-CM | POA: Diagnosis not present

## 2017-09-29 ENCOUNTER — Encounter: Payer: Self-pay | Admitting: *Deleted

## 2017-09-29 ENCOUNTER — Ambulatory Visit: Payer: Medicare HMO | Admitting: Cardiology

## 2017-09-29 ENCOUNTER — Encounter: Payer: Self-pay | Admitting: Cardiology

## 2017-09-29 VITALS — BP 115/80 | HR 72 | Ht 63.0 in | Wt 182.6 lb

## 2017-09-29 DIAGNOSIS — Z794 Long term (current) use of insulin: Secondary | ICD-10-CM | POA: Diagnosis not present

## 2017-09-29 DIAGNOSIS — E782 Mixed hyperlipidemia: Secondary | ICD-10-CM | POA: Diagnosis not present

## 2017-09-29 DIAGNOSIS — I208 Other forms of angina pectoris: Secondary | ICD-10-CM

## 2017-09-29 DIAGNOSIS — I25119 Atherosclerotic heart disease of native coronary artery with unspecified angina pectoris: Secondary | ICD-10-CM | POA: Diagnosis not present

## 2017-09-29 DIAGNOSIS — E119 Type 2 diabetes mellitus without complications: Secondary | ICD-10-CM | POA: Diagnosis not present

## 2017-09-29 DIAGNOSIS — I10 Essential (primary) hypertension: Secondary | ICD-10-CM | POA: Diagnosis not present

## 2017-09-29 NOTE — Progress Notes (Signed)
Cardiology Office Note  Date: 09/29/2017   ID: Brenda Yoder, DOB [birth year], MRN [id number]  PCP: Celene Squibb, MD  Consulting Cardiologist: Rozann Lesches, MD   Chief Complaint  Patient presents with  . Exertional jaw pain    History of Present Illness: Brenda Yoder is a 72 y.o. female self referred for cardiology evaluation. I know her professionally and have seen her husband as a patient before. She is a retired Pharmacist, community, states that she has been doing well in general, traveling with her husband. Approximately 2 weeks ago she was in [facility], was walking briskly up an incline and began to develop right sided jaw discomfort. She stopped and the symptoms went away. The next day she had recurrence when walking briskly in another situation. Otherwise, she has not noticed exertional symptoms at lower level activity.  She was seen by Dr. Johnsie Cancel back in 2013 in follow-up of nonobstructive coronary atherosclerosis documented at cardiac catheterization at that time. She has fairly long-standing type 2 diabetes mellitus, has not undergone ischemic testing within the last 5 years. I personally reviewed her ECG today which shows sinus rhythm with single PVC.  Recent lab work from November 2018 per Dr. Nevada Crane is outlined below. She reports compliance with her medications and no major intolerances.  Past Medical History:  Diagnosis Date  . Coronary atherosclerosis    Nonobstructive at cardiac catheterization 2013  . GERD (gastroesophageal reflux disease)   . Headache(784.0)   . History of breast cancer   . History of thrombocytopenia   . Hypertension   . Osteoarthritis   . Type 2 diabetes mellitus ([place])     Past Surgical History:  Procedure Laterality Date  . ABDOMINAL HYSTERECTOMY    . CATARACT EXTRACTION W/PHACO Left 04/01/2015   Procedure: CATARACT EXTRACTION PHACO AND INTRAOCULAR LENS PLACEMENT (IOC);  Surgeon: Williams Che, MD;  Location: [facility];  Service:  Ophthalmology;  Laterality: Left;  CDE 1.11  . CATARACT EXTRACTION W/PHACO Right 06/21/2016   Procedure: CATARACT EXTRACTION PHACO AND INTRAOCULAR LENS PLACEMENT ; CDE:  3.90;  Surgeon: Williams Che, MD;  Location: [facility];  Service: Ophthalmology;  Laterality: Right;  . CHOLECYSTECTOMY    . COLONOSCOPY  10/26/2012   Procedure: COLONOSCOPY;  Surgeon: Rogene Houston, MD;  Location: [facility] ENDO SUITE;  Service: Endoscopy;  Laterality: N/A;  830  . JOINT REPLACEMENT     lt knee  . LEFT HEART CATHETERIZATION WITH CORONARY ANGIOGRAM N/A 05/02/2012   Procedure: LEFT HEART CATHETERIZATION WITH CORONARY ANGIOGRAM;  Surgeon: Hillary Bow, MD;  Location: [facility] CATH LAB;  Service: Cardiovascular;  Laterality: N/A;  . MASS EXCISION  02/23/2012   Procedure: EXCISION MASS;  Surgeon: Wynonia Sours, MD;  Location: [place];  Service: Orthopedics;  Laterality: Right;  right forearm  . MASTECTOMY     bil  . TONSILLECTOMY      Current Outpatient Medications  Medication Sig Dispense Refill  . acetaminophen (TYLENOL) 500 MG tablet Take 500 mg by mouth every 6 (six) hours as needed for moderate pain.    Marland Kitchen acyclovir (ZOVIRAX) 400 MG tablet Take 400 mg by mouth 5 (five) times daily as needed (blisters).     Marland Kitchen aspirin EC 81 MG tablet Take 81 mg by mouth daily.    Marland Kitchen atorvastatin (LIPITOR) 20 MG tablet Take 20 mg by mouth daily.    . Calcium Carb-Vit D-C-E-Mineral (OS-CAL ULTRA) 600 MG TABS Take 600 mg by mouth  2 (two) times daily.    . Cholecalciferol (VITAMIN D PO) Take 1 tablet by mouth daily.    . DULoxetine (CYMBALTA) 30 MG capsule Take 30 mg by mouth daily.    . famotidine (PEPCID) 20 MG tablet Take 20 mg by mouth daily as needed for heartburn or indigestion.    Marland Kitchen ibuprofen (ADVIL,MOTRIN) 200 MG tablet Take 200 mg by mouth every 6 (six) hours as needed for moderate pain.    . INVOKAMET 50-1000 MG TABS Take 1 tablet by mouth 2 (two) times daily.    Marland Kitchen lisinopril-hydrochlorothiazide  (PRINZIDE,ZESTORETIC) 20-12.5 MG per tablet Take 1 tablet by mouth daily.     . metoprolol (LOPRESSOR) 25 MG tablet Take 1 tablet (25 mg total) by mouth 2 (two) times daily. 60 tablet 6  . Omega-3 Fatty Acids (FISH OIL) 1200 MG CAPS Take 1,200 mg by mouth daily.     . ondansetron (ZOFRAN) 8 MG tablet Take 8 mg by mouth every 8 (eight) hours as needed for nausea or vomiting.    . SUMAtriptan (IMITREX) 20 MG/ACT nasal spray Place 1 spray into the nose every 2 (two) hours as needed.    . zolpidem (AMBIEN) 10 MG tablet Take 10 mg by mouth at bedtime as needed for sleep.      No current facility-administered medications for this visit.    Allergies:  Patient has no known allergies.   Social History: The patient  reports that she quit smoking about 40 years ago. Her smoking use included cigarettes. She has a 6.00 pack-year smoking history. she has never used smokeless tobacco. She reports that she drinks alcohol. She reports that she does not use drugs.   Family History: The patient's family history is not on file.   ROS:  Please see the history of present illness. Otherwise, complete review of systems is positive for none.  All other systems are reviewed and negative.   Physical Exam: VS:  BP 115/80   Pulse 72   Ht 5\' 3"  (1.6 m)   Wt 182 lb 9.6 oz (82.8 kg)   SpO2 96%   BMI 32.35 kg/m , BMI Body mass index is 32.35 kg/m.  Wt Readings from Last 3 Encounters:  09/29/17 182 lb 9.6 oz (82.8 kg)  06/21/16 181 lb (82.1 kg)  06/18/16 181 lb 12.8 oz (82.5 kg)    General: Patient appears comfortable at rest. HEENT: Conjunctiva and lids normal, oropharynx clear. Neck: Supple, no elevated JVP or carotid bruits, no thyromegaly. Lungs: Clear to auscultation, nonlabored breathing at rest. Cardiac: Regular rate and rhythm, no S3 or significant systolic murmur, no pericardial rub. Abdomen: Soft, nontender, bowel sounds present. Extremities: No pitting edema, distal pulses 2+. Skin: Warm and  dry. Musculoskeletal: No kyphosis. Neuropsychiatric: Alert and oriented x3, affect grossly appropriate.  ECG: I personally reviewed the tracing from 03/25/2015 which showed sinus bradycardia.  Recent Labwork:  June 2016: Potassium 4.6, BUN 10, creatinine 0.77 November 2018: Hemoglobin 13.0, platelets 141, BUN 12, creatinine 0.82, potassium 4.5, AST 27, ALT 26, cholesterol 155, triglycerides 176, HDL 50, LDL 70, hemoglobin A1c 6.9  Other Studies Reviewed Today:  Cardiac catheterization 05/02/2012: On plain fluoroscopy there is at least moderate coronary calcification, particularly of the LAD, consistent with underlying coronary artery plaque  Left mainstem: Large vessel that bifurcates to a dominant circumflex and wrap around LAD.  Left anterior descending (LAD): The vessel has heavy calcification in the mid vessel.  There is likely diffuse 30-40% plaque throughout the mid  LAD in an area of extensive, heavy, segmental coronary calcification.  The diagonal has about 30-40% segmental proximal irregularity, non focal.  The distal LAD has mild diffuse luminal irregularity that is non obstructive.  The apical vessel supplies significant distal inferior myocardium.    Left circumflex (LCx): This a dominant vessel with proximal calcification.  The ramus branch has mild proximal irregularity with 40% segmental plaque proximally.  There is then a large marginal, smaller PLA, and PDA which are without focal narrowing.    Right coronary artery (RCA): This is non dominant vessel with a smooth 50-60% proximal narrowing after IC TNG  Left ventriculography: Left ventricular systolic function is normal, LVEF is estimated at 55-65%, there is no significant mitral regurgitation   Final Conclusions:   1.  Diffuse coronary calcification without critical focal CAD 2.  Moderate diffuse plaquing of the coronaries as noted. 3.  Preserved overall LV function.  Assessment and Plan:  1. Exertional jaw pain  concerning for angina. Cardiac catheterization in 2013 demonstrated nonobstructive but fairly diffuse coronary atherosclerosis. She has a long-standing history of type 2 diabetes mellitus without ischemic testing in the last 5 years. ECG is nonspecific. We have discussed options for follow-up testing including both noninvasive and invasive techniques. Plan is to proceed with a diagnostic cardiac catheterization via radial approach. She is in agreement to proceed and plans to speak with her daughter (a physician in [place], [place]) regarding convenient time for scheduling. Continue aspirin, beta blocker, and statin therapy for now.  2. Type 2 diabetes mellitus, followed by Dr. Nevada Crane. She is currently on Invokamet with recent hemoglobin A1c 6.9%.  3. Essential hypertension, on Lopressor and Prinzide. Blood pressure well controlled today.  4. Hyperlipidemia, on Lipitor. Recent LDL 70.  Current medicines were reviewed with the patient today.   Orders Placed This Encounter  Procedures  . EKG 12-Lead    Disposition: Follow-up after procedure.  Signed, Satira Sark, MD, [facility] 09/29/2017 4:40 PM    [place] [address], [place], [place] [address] Phone: [phone]; Fax: [phone]

## 2017-09-29 NOTE — Patient Instructions (Signed)
Medication Instructions:  Your physician recommends that you continue on your current medications as directed. Please refer to the Current Medication list given to you today.  Labwork: NONE  Testing/Procedures: CALL BACK TO SCHEDULE CATH   Follow-Up: Your physician recommends that you schedule a follow-up appointment in: [facility] CATH   Any Other Special Instructions Will Be Listed Below (If Applicable).  If you need a refill on your cardiac medications before your next appointment, please call your pharmacy.

## 2017-09-30 ENCOUNTER — Ambulatory Visit: Payer: Medicare HMO | Admitting: Cardiovascular Disease

## 2017-10-03 ENCOUNTER — Other Ambulatory Visit: Payer: Self-pay | Admitting: Cardiology

## 2017-10-03 DIAGNOSIS — I200 Unstable angina: Secondary | ICD-10-CM

## 2017-10-04 ENCOUNTER — Telehealth: Payer: Self-pay | Admitting: Cardiology

## 2017-10-04 NOTE — Telephone Encounter (Signed)
Pre-cert Verification for the following procedure   Left heart catheterization for accelerating angina scheduled for 10/31/2017 with Dr. Burt Knack

## 2017-10-25 NOTE — Progress Notes (Signed)
To update original office consultation on January 3, patient is scheduled to undergo diagnostic cardiac catheterization on February 4. Call placed to patient today per nursing to check on status, no progressive symptoms in the interim or new questions. We discussed risks and benefits at her initial encounter. She is still in agreement to proceed with planned procedure.  Satira Sark, M.D., F.A.C.C.

## 2017-10-28 ENCOUNTER — Telehealth: Payer: Self-pay | Admitting: *Deleted

## 2017-10-28 NOTE — Telephone Encounter (Signed)
Patient contacted pre-catheterization at [facility] scheduled for: Monday February 4,2019 10:30 AM Verified arrival time and place: [facility] A/NT 8 AM Confirmed no known allergies.  HOLD medications: Empagliflozin-Metformin 10/30/17, 10/31/17, and for 48 hours after Lisinopril-HCTZ AM of  Confirmed all other AM meds to be taken pre-cath with sip of water including: ASA 81 mg am of  Confirmed patient has responsible person to drive home post procedure and observe patient for 24 hours: yes

## 2017-10-31 ENCOUNTER — Encounter (HOSPITAL_COMMUNITY)
Admission: RE | Disposition: A | Discharge status: Home / Self Care | Payer: Self-pay | Source: Ambulatory Visit | Attending: Cardiovascular Disease

## 2017-10-31 ENCOUNTER — Ambulatory Visit (HOSPITAL_COMMUNITY)
Admission: RE | Admit: 2017-10-31 | Discharge: 2017-10-31 | Disposition: A | Discharge status: Home / Self Care | Payer: Medicare HMO | Source: Ambulatory Visit | Attending: Cardiovascular Disease | Admitting: Cardiovascular Disease

## 2017-10-31 DIAGNOSIS — K219 Gastro-esophageal reflux disease without esophagitis: Secondary | ICD-10-CM | POA: Insufficient documentation

## 2017-10-31 DIAGNOSIS — Z87891 Personal history of nicotine dependence: Secondary | ICD-10-CM | POA: Insufficient documentation

## 2017-10-31 DIAGNOSIS — E119 Type 2 diabetes mellitus without complications: Secondary | ICD-10-CM | POA: Diagnosis not present

## 2017-10-31 DIAGNOSIS — Z7982 Long term (current) use of aspirin: Secondary | ICD-10-CM | POA: Diagnosis not present

## 2017-10-31 DIAGNOSIS — I25118 Atherosclerotic heart disease of native coronary artery with other forms of angina pectoris: Secondary | ICD-10-CM | POA: Diagnosis not present

## 2017-10-31 DIAGNOSIS — I10 Essential (primary) hypertension: Secondary | ICD-10-CM | POA: Diagnosis not present

## 2017-10-31 DIAGNOSIS — E785 Hyperlipidemia, unspecified: Secondary | ICD-10-CM | POA: Insufficient documentation

## 2017-10-31 DIAGNOSIS — M1990 Unspecified osteoarthritis, unspecified site: Secondary | ICD-10-CM | POA: Diagnosis not present

## 2017-10-31 DIAGNOSIS — I2584 Coronary atherosclerosis due to calcified coronary lesion: Secondary | ICD-10-CM | POA: Insufficient documentation

## 2017-10-31 DIAGNOSIS — I200 Unstable angina: Secondary | ICD-10-CM

## 2017-10-31 HISTORY — PX: LEFT HEART CATH AND CORONARY ANGIOGRAPHY: CATH118249

## 2017-10-31 LAB — BASIC METABOLIC PANEL
Anion gap: 12 (ref 5–15)
BUN: 15 mg/dL (ref 6–20)
CALCIUM: 9.4 mg/dL (ref 8.9–10.3)
CHLORIDE: 102 mmol/L (ref 101–111)
CO2: 23 mmol/L (ref 22–32)
CREATININE: 0.82 mg/dL (ref 0.44–1.00)
GFR calc non Af Amer: >60 mL/min (ref >60)
Glucose, Bld: 206 mg/dL — ABNORMAL HIGH (ref 65–99)
Potassium: 4.2 mmol/L (ref 3.5–5.1)
Sodium: 137 mmol/L (ref 135–145)

## 2017-10-31 LAB — CBC
HCT: 42.8 % (ref 36.0–46.0)
Hemoglobin: 14.2 g/dL (ref 12.0–15.0)
MCH: 28.8 pg (ref 26.0–34.0)
MCHC: 33.2 g/dL (ref 30.0–36.0)
MCV: 86.8 fL (ref 78.0–100.0)
PLATELETS: 151 10*3/uL (ref 150–400)
RBC: 4.93 MIL/uL (ref 3.87–5.11)
RDW: 14.1 % (ref 11.5–15.5)
WBC: 4.7 10*3/uL (ref 4.0–10.5)

## 2017-10-31 LAB — GLUCOSE, CAPILLARY
Glucose-Capillary: 218 mg/dL — ABNORMAL HIGH (ref 65–99)
Glucose-Capillary: 164 mg/dL — ABNORMAL HIGH (ref 65–99)

## 2017-10-31 LAB — PROTIME-INR
INR: 1.03
PROTHROMBIN TIME: 13.5 s (ref 11.4–15.2)

## 2017-10-31 SURGERY — LEFT HEART CATH AND CORONARY ANGIOGRAPHY
Anesthesia: LOCAL

## 2017-10-31 MED ORDER — SODIUM CHLORIDE 0.9 % WEIGHT BASED INFUSION: 1.0000 mL/kg/h | INTRAVENOUS | Status: DC

## 2017-10-31 MED ORDER — LIDOCAINE HCL (PF) 1 % IJ SOLN
INTRAMUSCULAR | Status: AC
  Filled 2017-10-31: qty 30

## 2017-10-31 MED ORDER — HEPARIN SODIUM (PORCINE) 1000 UNIT/ML IJ SOLN
INTRAMUSCULAR | Status: DC | PRN
  Administered 2017-10-31: 4000 [IU] via INTRAVENOUS

## 2017-10-31 MED ORDER — IOPAMIDOL (ISOVUE-370) INJECTION 76%
INTRAVENOUS | Status: DC | PRN
  Administered 2017-10-31: 50 mL via INTRA_ARTERIAL

## 2017-10-31 MED ORDER — ASPIRIN 81 MG PO CHEW
CHEWABLE_TABLET | ORAL | Status: AC
  Administered 2017-10-31: 81 mg via ORAL
  Filled 2017-10-31: qty 1

## 2017-10-31 MED ORDER — LIDOCAINE HCL (PF) 1 % IJ SOLN
INTRAMUSCULAR | Status: DC | PRN
  Administered 2017-10-31: 2 mL

## 2017-10-31 MED ORDER — SODIUM CHLORIDE 0.9 % IV SOLN: 250.0000 mL | INTRAVENOUS | Status: DC | PRN

## 2017-10-31 MED ORDER — SODIUM CHLORIDE 0.9% FLUSH: 3.0000 mL | INTRAVENOUS | Status: DC | PRN

## 2017-10-31 MED ORDER — VERAPAMIL HCL 2.5 MG/ML IV SOLN
INTRAVENOUS | Status: AC
  Filled 2017-10-31: qty 2

## 2017-10-31 MED ORDER — IOPAMIDOL (ISOVUE-370) INJECTION 76%
INTRAVENOUS | Status: AC
  Filled 2017-10-31: qty 100

## 2017-10-31 MED ORDER — HEPARIN (PORCINE) IN NACL 2-0.9 UNIT/ML-% IJ SOLN
INTRAMUSCULAR | Status: AC | PRN
  Administered 2017-10-31: 1000 mL via INTRA_ARTERIAL

## 2017-10-31 MED ORDER — FENTANYL CITRATE (PF) 100 MCG/2ML IJ SOLN
INTRAMUSCULAR | Status: DC | PRN
  Administered 2017-10-31: 25 ug via INTRAVENOUS

## 2017-10-31 MED ORDER — MIDAZOLAM HCL 2 MG/2ML IJ SOLN
INTRAMUSCULAR | Status: AC
  Filled 2017-10-31: qty 2

## 2017-10-31 MED ORDER — HEPARIN SODIUM (PORCINE) 1000 UNIT/ML IJ SOLN
INTRAMUSCULAR | Status: AC
  Filled 2017-10-31: qty 1

## 2017-10-31 MED ORDER — SODIUM CHLORIDE 0.9% FLUSH: 3.0000 mL | Freq: Two times a day (BID) | INTRAVENOUS | Status: DC

## 2017-10-31 MED ORDER — ASPIRIN 81 MG PO CHEW
81.0000 mg | CHEWABLE_TABLET | ORAL | Status: AC
  Administered 2017-10-31: 81 mg via ORAL

## 2017-10-31 MED ORDER — MIDAZOLAM HCL 2 MG/2ML IJ SOLN
INTRAMUSCULAR | Status: DC | PRN
  Administered 2017-10-31: 2 mg via INTRAVENOUS

## 2017-10-31 MED ORDER — HEPARIN (PORCINE) IN NACL 2-0.9 UNIT/ML-% IJ SOLN
INTRAMUSCULAR | Status: AC
  Filled 2017-10-31: qty 1000

## 2017-10-31 MED ORDER — FENTANYL CITRATE (PF) 100 MCG/2ML IJ SOLN
INTRAMUSCULAR | Status: AC
  Filled 2017-10-31: qty 2

## 2017-10-31 MED ORDER — SODIUM CHLORIDE 0.9 % WEIGHT BASED INFUSION
3.0000 mL/kg/h | INTRAVENOUS | Status: AC
  Administered 2017-10-31: 3 mL/kg/h via INTRAVENOUS

## 2017-10-31 MED ORDER — VERAPAMIL HCL 2.5 MG/ML IV SOLN
INTRAVENOUS | Status: DC | PRN
  Administered 2017-10-31: 11:00:00 via INTRA_ARTERIAL

## 2017-10-31 SURGICAL SUPPLY — 9 items
CATH 5FR JL3.5 JR4 ANG PIG MP (CATHETERS) ×1 IMPLANT
DEVICE RAD COMP TR BAND LRG (VASCULAR PRODUCTS) ×1 IMPLANT
GLIDESHEATH SLEND SS 6F .021 (SHEATH) ×1 IMPLANT
GUIDEWIRE INQWIRE 1.5J.035X260 (WIRE) IMPLANT
INQWIRE 1.5J .035X260CM (WIRE) ×4
KIT HEART LEFT (KITS) ×2 IMPLANT
PACK CARDIAC CATHETERIZATION (CUSTOM PROCEDURE TRAY) ×2 IMPLANT
TRANSDUCER W/STOPCOCK (MISCELLANEOUS) ×2 IMPLANT
TUBING CIL FLEX 10 FLL-RA (TUBING) ×2 IMPLANT

## 2017-10-31 NOTE — Research (Signed)
CADFEM Informed Consent   Subject Name: Brenda Yoder  Subject met inclusion and exclusion criteria.  The informed consent form, study requirements and expectations were reviewed with the subject and questions and concerns were addressed prior to the signing of the consent form.  The subject verbalized understanding of the trail requirements.  The subject agreed to participate in the CADFEM trial and signed the informed consent.  The informed consent was obtained prior to performance of any protocol-specific procedures for the subject.  A copy of the signed informed consent was given to the subject and a copy was placed in the subject's medical record.  Philemon Kingdom D 10/31/2017, 0930 AM

## 2017-10-31 NOTE — Discharge Instructions (Signed)
**Note Arieal Cuoco-identified via Obfuscation** Radial Site Care °Refer to this sheet in the next few weeks. These instructions provide you with information about caring for yourself after your procedure. Your health care provider may also give you more specific instructions. Your treatment has been planned according to current medical practices, but problems sometimes occur. Call your health care provider if you have any problems or questions after your procedure. °What can I expect after the procedure? °After your procedure, it is typical to have the following: °· Bruising at the radial site that usually fades within 1-2 weeks. °· Blood collecting in the tissue (hematoma) that may be painful to the touch. It should usually decrease in size and tenderness within 1-2 weeks. ° °Follow these instructions at home: °· Take medicines only as directed by your health care provider. °· You may shower 24-48 hours after the procedure or as directed by your health care provider. Remove the bandage (dressing) and gently wash the site with plain soap and water. Pat the area dry with a clean towel. Do not rub the site, because this may cause bleeding. °· Do not take baths, swim, or use a hot tub until your health care provider approves. °· Check your insertion site every day for redness, swelling, or drainage. °· Do not apply powder or lotion to the site. °· Do not flex or bend the affected arm for 24 hours or as directed by your health care provider. °· Do not push or pull heavy objects with the affected arm for 24 hours or as directed by your health care provider. °· Do not lift over 10 lb (4.5 kg) for 5 days after your procedure or as directed by your health care provider. °· Ask your health care provider when it is okay to: °? Return to work or school. °? Resume usual physical activities or sports. °? Resume sexual activity. °· Do not drive home if you are discharged the same day as the procedure. Have someone else drive you. °· You may drive 24 hours after the procedure  unless otherwise instructed by your health care provider. °· Do not operate machinery or power tools for 24 hours after the procedure. °· If your procedure was done as an outpatient procedure, which means that you went home the same day as your procedure, a responsible adult should be with you for the first 24 hours after you arrive home. °· Keep all follow-up visits as directed by your health care provider. This is important. °Contact a health care provider if: °· You have a fever. °· You have chills. °· You have increased bleeding from the radial site. Hold pressure on the site. °Get help right away if: °· You have unusual pain at the radial site. °· You have redness, warmth, or swelling at the radial site. °· You have drainage (other than a small amount of blood on the dressing) from the radial site. °· The radial site is bleeding, and the bleeding does not stop after 30 minutes of holding steady pressure on the site. °· Your arm or hand becomes pale, cool, tingly, or numb. °This information is not intended to replace advice given to you by your health care provider. Make sure you discuss any questions you have with your health care provider. °Document Released: 10/16/2010 Document Revised: 02/19/2016 Document Reviewed: 04/01/2014 °Elsevier Interactive Patient Education © 2018 Elsevier Inc. ° °

## 2017-10-31 NOTE — H&P (Signed)
Cardiology Office Note  Date: 09/29/2017   ID: Brenda Yoder, DOB 1945/10/22, MRN [id number]  PCP: Celene Squibb, MD            Consulting Cardiologist: Rozann Lesches, MD      Chief Complaint  Patient presents with  . Exertional jaw pain    History of Present Illness: Brenda Yoder is a 72 y.o. female self referred for cardiology evaluation. I know her professionally and have seen her husband as a patient before. She is a retired Pharmacist, community, states that she has been doing well in general, traveling with her husband. Approximately 2 weeks ago she was in [facility], was walking briskly up an incline and began to develop right sided jaw discomfort. She stopped and the symptoms went away. The next day she had recurrence when walking briskly in another situation. Otherwise, she has not noticed exertional symptoms at lower level activity.  She was seen by Dr. Johnsie Cancel back in 2013 in follow-up of nonobstructive coronary atherosclerosis documented at cardiac catheterization at that time. She has fairly long-standing type 2 diabetes mellitus, has not undergone ischemic testing within the last 5 years. I personally reviewed her ECG today which shows sinus rhythm with single PVC.  Recent lab work from November 2018 per Dr. Nevada Crane is outlined below. She reports compliance with her medications and no major intolerances.      Past Medical History:  Diagnosis Date  . Coronary atherosclerosis    Nonobstructive at cardiac catheterization 2013  . GERD (gastroesophageal reflux disease)   . Headache(784.0)   . History of breast cancer   . History of thrombocytopenia   . Hypertension   . Osteoarthritis   . Type 2 diabetes mellitus ([place])          Past Surgical History:  Procedure Laterality Date  . ABDOMINAL HYSTERECTOMY    . CATARACT EXTRACTION W/PHACO Left 04/01/2015   Procedure: CATARACT EXTRACTION PHACO AND INTRAOCULAR LENS PLACEMENT (IOC);  Surgeon: Williams Che, MD;   [facility];  Service: Ophthalmology;  Laterality: Left;  CDE 1.11  . CATARACT EXTRACTION W/PHACO Right 06/21/2016   Procedure: CATARACT EXTRACTION PHACO AND INTRAOCULAR LENS PLACEMENT ; CDE:  3.90;  Surgeon: Williams Che, MD;  Location: [facility];  Service: Ophthalmology;  Laterality: Right;  . CHOLECYSTECTOMY    . COLONOSCOPY  10/26/2012   Procedure: COLONOSCOPY;  Surgeon: Rogene Houston, MD;  [facility];  Service: Endoscopy;  Laterality: N/A;  830  . JOINT REPLACEMENT     lt knee  . LEFT HEART CATHETERIZATION WITH CORONARY ANGIOGRAM N/A 05/02/2012   Procedure: LEFT HEART CATHETERIZATION WITH CORONARY ANGIOGRAM;  Surgeon: Hillary Bow, MD;  Location: [facility] CATH LAB;  Service: Cardiovascular;  Laterality: N/A;  . MASS EXCISION  02/23/2012   Procedure: EXCISION MASS;  Surgeon: Wynonia Sours, MD;  Location: [facility];  Service: Orthopedics;  Laterality: Right;  right forearm  . MASTECTOMY     bil  . TONSILLECTOMY            Current Outpatient Medications  Medication Sig Dispense Refill  . acetaminophen (TYLENOL) 500 MG tablet Take 500 mg by mouth every 6 (six) hours as needed for moderate pain.    Marland Kitchen acyclovir (ZOVIRAX) 400 MG tablet Take 400 mg by mouth 5 (five) times daily as needed (blisters).     Marland Kitchen aspirin EC 81 MG tablet Take 81 mg by mouth daily.    Marland Kitchen atorvastatin (LIPITOR)  20 MG tablet Take 20 mg by mouth daily.    . Calcium Carb-Vit D-C-E-Mineral (OS-CAL ULTRA) 600 MG TABS Take 600 mg by mouth 2 (two) times daily.    . Cholecalciferol (VITAMIN D PO) Take 1 tablet by mouth daily.    . DULoxetine (CYMBALTA) 30 MG capsule Take 30 mg by mouth daily.    . famotidine (PEPCID) 20 MG tablet Take 20 mg by mouth daily as needed for heartburn or indigestion.    Marland Kitchen ibuprofen (ADVIL,MOTRIN) 200 MG tablet Take 200 mg by mouth every 6 (six) hours as needed for moderate pain.    . INVOKAMET 50-1000 MG TABS Take 1 tablet by mouth 2  (two) times daily.    Marland Kitchen lisinopril-hydrochlorothiazide (PRINZIDE,ZESTORETIC) 20-12.5 MG per tablet Take 1 tablet by mouth daily.     . metoprolol (LOPRESSOR) 25 MG tablet Take 1 tablet (25 mg total) by mouth 2 (two) times daily. 60 tablet 6  . Omega-3 Fatty Acids (FISH OIL) 1200 MG CAPS Take 1,200 mg by mouth daily.     . ondansetron (ZOFRAN) 8 MG tablet Take 8 mg by mouth every 8 (eight) hours as needed for nausea or vomiting.    . SUMAtriptan (IMITREX) 20 MG/ACT nasal spray Place 1 spray into the nose every 2 (two) hours as needed.    . zolpidem (AMBIEN) 10 MG tablet Take 10 mg by mouth at bedtime as needed for sleep.      No current facility-administered medications for this visit.    Allergies:  Patient has no known allergies.   Social History: The patient  reports that she quit smoking about 40 years ago. Her smoking use included cigarettes. She has a 6.00 pack-year smoking history. she has never used smokeless tobacco. She reports that she drinks alcohol. She reports that she does not use drugs.   Family History: The patient's family history is not on file.   ROS:  Please see the history of present illness. Otherwise, complete review of systems is positive for none.  All other systems are reviewed and negative.   Physical Exam: VS:  BP 115/80   Pulse 72   Ht 5\' 3"  (1.6 m)   Wt 182 lb 9.6 oz (82.8 kg)   SpO2 96%   BMI 32.35 kg/m , BMI Body mass index is 32.35 kg/m.     Wt Readings from Last 3 Encounters:  09/29/17 182 lb 9.6 oz (82.8 kg)  06/21/16 181 lb (82.1 kg)  06/18/16 181 lb 12.8 oz (82.5 kg)    General: Patient appears comfortable at rest. HEENT: Conjunctiva and lids normal, oropharynx clear. Neck: Supple, no elevated JVP or carotid bruits, no thyromegaly. Lungs: Clear to auscultation, nonlabored breathing at rest. Cardiac: Regular rate and rhythm, no S3 or significant systolic murmur, no pericardial rub. Abdomen: Soft, nontender, bowel sounds  present. Extremities: No pitting edema, distal pulses 2+. Skin: Warm and dry. Musculoskeletal: No kyphosis. Neuropsychiatric: Alert and oriented x3, affect grossly appropriate.  ECG: I personally reviewed the tracing from 03/25/2015 which showed sinus bradycardia.  Recent Labwork:  June 2016: Potassium 4.6, BUN 10, creatinine 0.77 November 2018: Hemoglobin 13.0, platelets 141, BUN 12, creatinine 0.82, potassium 4.5, AST 27, ALT 26, cholesterol 155, triglycerides 176, HDL 50, LDL 70, hemoglobin A1c 6.9  Other Studies Reviewed Today:  Cardiac catheterization 05/02/2012: On plain fluoroscopy there is at least moderate coronary calcification, particularly of the LAD, consistent with underlying coronary artery plaque  Left mainstem: Large vessel that bifurcates to a dominant  circumflex and wrap around LAD.  Left anterior descending (LAD): The vessel has heavy calcification in the mid vessel. There is likely diffuse 30-40% plaque throughout the mid LAD in an area of extensive, heavy, segmental coronary calcification. The diagonal has about 30-40% segmental proximal irregularity, non focal. The distal LAD has mild diffuse luminal irregularity that is non obstructive. The apical vessel supplies significant distal inferior myocardium.   Left circumflex (LCx): This a dominant vessel with proximal calcification. The ramus branch has mild proximal irregularity with 40% segmental plaque proximally. There is then a large marginal, smaller PLA, and PDA which are without focal narrowing.   Right coronary artery (RCA): This is non dominant vessel with a smooth 50-60% proximal narrowing after IC TNG  Left ventriculography: Left ventricular systolic function is normal, LVEF is estimated at 55-65%, there is no significant mitral regurgitation   Final Conclusions:  1. Diffuse coronary calcification without critical focal CAD 2. Moderate diffuse plaquing of the coronaries as noted. 3.  Preserved overall LV function.  Assessment and Plan:  1. Exertional jaw pain concerning for angina. Cardiac catheterization in 2013 demonstrated nonobstructive but fairly diffuse coronary atherosclerosis. She has a long-standing history of type 2 diabetes mellitus without ischemic testing in the last 5 years. ECG is nonspecific. We have discussed options for follow-up testing including both noninvasive and invasive techniques. Plan is to proceed with a diagnostic cardiac catheterization via radial approach. She is in agreement to proceed and plans to speak with her daughter (a physician in [place], [place]) regarding convenient time for scheduling. Continue aspirin, beta blocker, and statin therapy for now.  2. Type 2 diabetes mellitus, followed by Dr. Nevada Crane. She is currently on Invokamet with recent hemoglobin A1c 6.9%.  3. Essential hypertension, on Lopressor and Prinzide. Blood pressure well controlled today.  4. Hyperlipidemia, on Lipitor. Recent LDL 70.  Current medicines were reviewed with the patient today.   ADDENDUM: Pt interviewed and examined. Her daughter is at the bedside. She reports no recurrent angina since the initial episode described above by Dr Domenic Polite. She reports no interval change in any sx's. i've reviewed risks, indications, alternatives to cath and PCI with the patient and her daughter who is a Chief Technology Officer Care MD in [place]. They understand and agree to proceed. Cath films from 2013 also reviewed.   Sherren Mocha 10/31/2017 10:33 AM

## 2017-11-01 ENCOUNTER — Encounter (HOSPITAL_COMMUNITY): Payer: Self-pay | Admitting: Cardiovascular Disease

## 2017-11-22 ENCOUNTER — Encounter (INDEPENDENT_AMBULATORY_CARE_PROVIDER_SITE_OTHER): Payer: Self-pay | Admitting: *Deleted

## 2017-11-25 ENCOUNTER — Encounter: Payer: Self-pay | Admitting: Physician Assistant

## 2017-11-25 NOTE — Progress Notes (Signed)
Cardiology Office Note    Date:  11/29/2017  ID:  Brenda Yoder, DOB [birth year], MRN [id number] PCP:  Celene Squibb, MD  Cardiologist:  Dr. 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 Dr. Domenic Polite knows her professionally (retired ER physician). She was recently seen for an episode of exertional jaw pain at [facility]. Recent labs showed K 4.2, glucose 206, Cr 0.82, CBC wnl. LDL in 07/2017 was 70 and LFTs wnl at that time. Cath showed moderate 2V CAD with heavily calcified mid LAD (50%) and small non-dominant prox RCA (50%), normal LVEDP, with recommendation to treat medically.   She returns for follow-up feeling well. Although no med changes were made after cath, she has not had any recurrent episodes of chest pain. She has not had any SOB. She remains very active. She does not follow her BP regularly but states it usually runs in the 110 range. She was born in [place] but lived in [place] ([place], [place], Social research officer, government) due to her father being a Programmer, multimedia.  Past Medical History:  Diagnosis Date  . Coronary atherosclerosis    a. 10/2017: Moderate 2V CAD with heavily calcified mid LAD (50%) and small non-dominant prox RCA (50%), normal LVEDP, with recommendation to treat medically.   Marland Kitchen GERD (gastroesophageal reflux disease)   . Headache(784.0)   . History of breast cancer   . History of thrombocytopenia   . Hypertension   . Osteoarthritis   . Type 2 diabetes mellitus ([place])     Past Surgical History:  Procedure Laterality Date  . ABDOMINAL HYSTERECTOMY    . CATARACT EXTRACTION W/PHACO Left 04/01/2015   Procedure: CATARACT EXTRACTION PHACO AND INTRAOCULAR LENS PLACEMENT (IOC);  Surgeon: Williams Che, MD;  Location: [facility];  Service: Ophthalmology;  Laterality: Left;  CDE 1.11  . CATARACT  EXTRACTION W/PHACO Right 06/21/2016   Procedure: CATARACT EXTRACTION PHACO AND INTRAOCULAR LENS PLACEMENT ; CDE:  3.90;  Surgeon: Williams Che, MD;  Location: [facility];  Service: Ophthalmology;  Laterality: Right;  . CHOLECYSTECTOMY    . COLONOSCOPY  10/26/2012   Procedure: COLONOSCOPY;  Surgeon: Rogene Houston, MD;  [facility];  Service: Endoscopy;  Laterality: N/A;  830  . JOINT REPLACEMENT     lt knee  . LEFT HEART CATH AND CORONARY ANGIOGRAPHY N/A 10/31/2017   Procedure: LEFT HEART CATH AND CORONARY ANGIOGRAPHY;  Surgeon: Sherren Mocha, MD;  Location: [facility] CV LAB;  Service: Cardiovascular;  Laterality: N/A;  . LEFT HEART CATHETERIZATION WITH CORONARY ANGIOGRAM N/A 05/02/2012   Procedure: LEFT HEART CATHETERIZATION WITH CORONARY ANGIOGRAM;  Surgeon: Hillary Bow, MD;  Location[facility] CATH LAB;  Service: Cardiovascular;  Laterality: N/A;  . MASS EXCISION  02/23/2012   Procedure: EXCISION MASS;  Surgeon: Wynonia Sours, MD;  Location: [facility];  Service: Orthopedics;  Laterality: Right;  right forearm  . MASTECTOMY     bil  . TONSILLECTOMY      Current Medications: Current Meds  Medication Sig  . acyclovir (ZOVIRAX) 400 MG tablet Take 400 mg by mouth 3 (three) times daily as needed (blisters).   Marland Kitchen aspirin EC 81 MG tablet Take 81 mg by mouth daily.  Marland Kitchen atorvastatin (LIPITOR) 20 MG tablet Take 20 mg by mouth at bedtime.   . Calcium Carb-Vit  D-C-E-Mineral (OS-CAL ULTRA) 600 MG TABS Take 600 mg by mouth 2 (two) times daily.  . DULoxetine (CYMBALTA) 30 MG capsule Take 30 mg by mouth 2 (two) times daily.   . Empagliflozin-Metformin HCl (SYNJARDY) 12.01-999 MG TABS Take 1 tablet by mouth 2 (two) times daily.  . famotidine (PEPCID) 20 MG tablet Take 20 mg by mouth daily as needed for heartburn or indigestion.  Marland Kitchen liraglutide (VICTOZA) 18 MG/3ML SOPN Inject into the skin.  Marland Kitchen lisinopril-hydrochlorothiazide (PRINZIDE,ZESTORETIC) 20-12.5 MG per tablet Take 1 tablet  by mouth daily.   . metoprolol (LOPRESSOR) 25 MG tablet Take 1 tablet (25 mg total) by mouth 2 (two) times daily.  . ondansetron (ZOFRAN) 8 MG tablet Take 8 mg by mouth every 8 (eight) hours as needed for nausea or vomiting.  . SUMAtriptan (IMITREX) 20 MG/ACT nasal spray Place 1 spray into the nose every 2 (two) hours as needed for migraine.   Marland Kitchen zolpidem (AMBIEN) 10 MG tablet Take 10 mg by mouth at bedtime as needed for sleep.     Allergies:   Patient has no known allergies.   Social History   Socioeconomic History  . Marital status: Widowed    Spouse name: None  . Number of children: None  . Years of education: None  . Highest education level: None  Social Needs  . Financial resource strain: None  . Food insecurity - worry: None  . Food insecurity - inability: None  . Transportation needs - medical: None  . Transportation needs - non-medical: None  Occupational History  . None  Tobacco Use  . Smoking status: Former Smoker    Packs/day: 0.50    Years: 12.00    Pack years: 6.00    Types: Cigarettes    Last attempt to quit: 09/27/1977    Years since quitting: 40.2  . Smokeless tobacco: Never Used  Substance and Sexual Activity  . Alcohol use: Yes    Comment: social  . Drug use: No  . Sexual activity: No    Birth control/protection: None  Other Topics Concern  . None  Social History Narrative  . None     Family History:  Family History  Problem Relation Age of Onset  . Cancer Mother   . Heart disease Father     ROS:   Please see the history of present illness.  All other systems are reviewed and otherwise negative.    PHYSICAL EXAM:   VS:  BP 134/78   Pulse 78   Ht 5' 3.5" (1.613 m)   Wt 184 lb (83.5 kg)   SpO2 98%   BMI 32.08 kg/m   BMI: Body mass index is 32.08 kg/m. GEN: Well nourished, well developed WF, in no acute distress  HEENT: normocephalic, atraumatic Neck: no JVD, carotid bruits, or masses Cardiac: RRR; no murmurs, rubs, or gallops, no  edema  Respiratory:  clear to auscultation bilaterally, normal work of breathing GI: soft, nontender, nondistended, + BS MS: no deformity or atrophy  Skin: warm and dry, no rash. Right radial cath site without hematoma or ecchymosis; good pulse. Neuro:  Alert and Oriented x 3, Strength and sensation are intact, follows commands Psych: euthymic mood, full affect  Wt Readings from Last 3 Encounters:  11/29/17 184 lb (83.5 kg)  10/31/17 181 lb (82.1 kg)  09/29/17 182 lb 9.6 oz (82.8 kg)      Studies/Labs Reviewed:   EKG:  EKG was not ordered today  Recent Labs: 10/31/2017: BUN 15; Creatinine, Ser  0.82; Hemoglobin 14.2; Platelets 151; Potassium 4.2; Sodium 137   Lipid Panel    Component Value Date/Time   CHOL 165 05/02/2012 0309   TRIG 188 (H) 05/02/2012 0309   HDL 44 05/02/2012 0309   CHOLHDL 3.8 05/02/2012 0309   VLDL 38 05/02/2012 0309   LDLCALC 83 05/02/2012 0309    Additional studies/ records that were reviewed today include: Summarized above.   ASSESSMENT & PLAN:   1. CAD - recent cath with nonobstructive disease. Discussed alternative mechanisms of chest pain including possible vasospasm or microvascular angina. She has felt well since that episode. Will continue to focus on risk factor modification at this time. Also offered her NTG prescription to have on hand to try for any future episodes. She will notify our office with any concerns. 2. HTN - BP slightly above goal today but she reports this is unusual for her. This is corroborated in the notes. Advised to monitor BP regularly at home and call if running >671 systolic. 3. Diabetes mellitus in obese - she follows regularly for this. She is already on an SGLT2 inhibitor as part of her regimen as well. 4. Lipid screening - most recent LDL 07/2017 was normal. However, given guidelines of DM and concomitant CAD she should at least be on a moderate if not high intensity statin. She reports some myalgias in her hands that she  is not sure may be related to atorvastatin but they are not particularly bothersome. She would like to try atorvastatin 40mg  and see how she does with this. Will plan f/u liver/lipid profile in 6-8 weeks.  Disposition: F/u with Dr. Domenic Polite in 6 months.   Medication Adjustments/Labs and Tests Ordered: Current medicines are reviewed at length with the patient today.  Concerns regarding medicines are outlined above. Medication changes, Labs and Tests ordered today are summarized above and listed in the Patient Instructions accessible in Encounters.   Signed, Charlie Pitter, PA-C  11/29/2017 2:11 PM    [facility] Location in [place]. [place], [place] [address] Ph: [phone]; Fax [phone]

## 2017-11-28 DIAGNOSIS — Z136 Encounter for screening for cardiovascular disorders: Secondary | ICD-10-CM

## 2017-11-28 DIAGNOSIS — Z13220 Encounter for screening for lipoid disorders: Secondary | ICD-10-CM | POA: Insufficient documentation

## 2017-11-29 ENCOUNTER — Encounter: Payer: Self-pay | Admitting: Physician Assistant

## 2017-11-29 ENCOUNTER — Ambulatory Visit: Payer: Medicare HMO | Admitting: Physician Assistant

## 2017-11-29 VITALS — BP 134/78 | HR 78 | Ht 63.5 in | Wt 184.0 lb

## 2017-11-29 DIAGNOSIS — I10 Essential (primary) hypertension: Secondary | ICD-10-CM | POA: Diagnosis not present

## 2017-11-29 DIAGNOSIS — E1169 Type 2 diabetes mellitus with other specified complication: Secondary | ICD-10-CM | POA: Diagnosis not present

## 2017-11-29 DIAGNOSIS — E669 Obesity, unspecified: Secondary | ICD-10-CM | POA: Diagnosis not present

## 2017-11-29 DIAGNOSIS — Z13220 Encounter for screening for lipoid disorders: Secondary | ICD-10-CM

## 2017-11-29 DIAGNOSIS — I2510 Atherosclerotic heart disease of native coronary artery without angina pectoris: Secondary | ICD-10-CM

## 2017-11-29 DIAGNOSIS — Z136 Encounter for screening for cardiovascular disorders: Secondary | ICD-10-CM

## 2017-11-29 MED ORDER — NITROGLYCERIN 0.4 MG/SPRAY TL SOLN: 1.0000 | 12 refills | Status: AC | PRN

## 2017-11-29 MED ORDER — ATORVASTATIN CALCIUM 40 MG PO TABS: 40.0000 mg | ORAL_TABLET | Freq: Every day | ORAL | 3 refills | Status: DC

## 2017-11-29 NOTE — Patient Instructions (Signed)
Medication Instructions:  Your physician has recommended you make the following change in your medication: Increase Atorvastatin to 40 mg Daily    Labwork: Your physician recommends that you return for lab work in: 6-8 Weeks Fasting ( LFT's and Lipids)    Testing/Procedures: NONE   Follow-Up: Your physician wants you to follow-up in: 6 Months. You will receive a reminder letter in the mail two months in advance. If you don't receive a letter, please call our office to schedule the follow-up appointment.   Any Other Special Instructions Will Be Listed Below (If Applicable). Check your Blood Pressure Regularly and call if Systolic is running >276.      If you need a refill on your cardiac medications before your next appointment, please call your pharmacy.  Thank you for choosing [facility]!

## 2017-12-06 ENCOUNTER — Telehealth: Payer: Self-pay | Admitting: *Deleted

## 2017-12-06 DIAGNOSIS — Z13220 Encounter for screening for lipoid disorders: Secondary | ICD-10-CM

## 2017-12-06 DIAGNOSIS — I25118 Atherosclerotic heart disease of native coronary artery with other forms of angina pectoris: Secondary | ICD-10-CM

## 2017-12-06 DIAGNOSIS — E782 Mixed hyperlipidemia: Secondary | ICD-10-CM

## 2017-12-06 DIAGNOSIS — Z136 Encounter for screening for cardiovascular disorders: Secondary | ICD-10-CM

## 2017-12-06 NOTE — Telephone Encounter (Signed)
-----   Message from Charlie Pitter, [place] sent at 12/05/2017  7:03 AM EDT ----- Regarding: Labs Hi Kisha, I got a message that Dr [name] future labs timed out (LFTs/lipid profile) - can you re-enter these as we had planned? thanks

## 2018-02-01 DIAGNOSIS — L820 Inflamed seborrheic keratosis: Secondary | ICD-10-CM | POA: Diagnosis not present

## 2018-02-01 DIAGNOSIS — B078 Other viral warts: Secondary | ICD-10-CM | POA: Diagnosis not present

## 2018-02-13 DIAGNOSIS — M609 Myositis, unspecified: Secondary | ICD-10-CM | POA: Diagnosis not present

## 2018-02-13 DIAGNOSIS — E119 Type 2 diabetes mellitus without complications: Secondary | ICD-10-CM | POA: Diagnosis not present

## 2018-02-13 DIAGNOSIS — E782 Mixed hyperlipidemia: Secondary | ICD-10-CM | POA: Diagnosis not present

## 2018-02-15 DIAGNOSIS — D696 Thrombocytopenia, unspecified: Secondary | ICD-10-CM | POA: Diagnosis not present

## 2018-02-15 DIAGNOSIS — E119 Type 2 diabetes mellitus without complications: Secondary | ICD-10-CM | POA: Diagnosis not present

## 2018-02-15 DIAGNOSIS — M8580 Other specified disorders of bone density and structure, unspecified site: Secondary | ICD-10-CM | POA: Diagnosis not present

## 2018-02-15 DIAGNOSIS — J060 Acute laryngopharyngitis: Secondary | ICD-10-CM | POA: Diagnosis not present

## 2018-02-15 DIAGNOSIS — I10 Essential (primary) hypertension: Secondary | ICD-10-CM | POA: Diagnosis not present

## 2018-02-15 DIAGNOSIS — Z6832 Body mass index (BMI) 32.0-32.9, adult: Secondary | ICD-10-CM | POA: Diagnosis not present

## 2018-02-15 DIAGNOSIS — E782 Mixed hyperlipidemia: Secondary | ICD-10-CM | POA: Diagnosis not present

## 2018-02-15 DIAGNOSIS — M1990 Unspecified osteoarthritis, unspecified site: Secondary | ICD-10-CM | POA: Diagnosis not present

## 2018-02-15 DIAGNOSIS — M5440 Lumbago with sciatica, unspecified side: Secondary | ICD-10-CM | POA: Diagnosis not present

## 2018-02-15 DIAGNOSIS — R945 Abnormal results of liver function studies: Secondary | ICD-10-CM | POA: Diagnosis not present

## 2018-05-08 ENCOUNTER — Other Ambulatory Visit: Payer: Self-pay | Admitting: Internal Medicine

## 2018-05-08 DIAGNOSIS — Z780 Asymptomatic menopausal state: Secondary | ICD-10-CM

## 2018-05-25 ENCOUNTER — Ambulatory Visit (HOSPITAL_COMMUNITY)
Admission: RE | Admit: 2018-05-25 | Discharge: 2018-05-25 | Disposition: A | Discharge status: Home / Self Care | Payer: Medicare HMO | Source: Ambulatory Visit | Attending: Internal Medicine | Admitting: Internal Medicine

## 2018-05-25 DIAGNOSIS — M85851 Other specified disorders of bone density and structure, right thigh: Secondary | ICD-10-CM | POA: Diagnosis not present

## 2018-05-25 DIAGNOSIS — Z780 Asymptomatic menopausal state: Secondary | ICD-10-CM | POA: Diagnosis not present

## 2018-05-25 DIAGNOSIS — M8589 Other specified disorders of bone density and structure, multiple sites: Secondary | ICD-10-CM | POA: Insufficient documentation

## 2018-05-26 DIAGNOSIS — E871 Hypo-osmolality and hyponatremia: Secondary | ICD-10-CM | POA: Diagnosis not present

## 2018-05-26 DIAGNOSIS — E119 Type 2 diabetes mellitus without complications: Secondary | ICD-10-CM | POA: Diagnosis not present

## 2018-05-26 DIAGNOSIS — M8580 Other specified disorders of bone density and structure, unspecified site: Secondary | ICD-10-CM | POA: Diagnosis not present

## 2018-05-26 DIAGNOSIS — M1990 Unspecified osteoarthritis, unspecified site: Secondary | ICD-10-CM | POA: Diagnosis not present

## 2018-05-26 DIAGNOSIS — I10 Essential (primary) hypertension: Secondary | ICD-10-CM | POA: Diagnosis not present

## 2018-05-26 DIAGNOSIS — D696 Thrombocytopenia, unspecified: Secondary | ICD-10-CM | POA: Diagnosis not present

## 2018-05-26 DIAGNOSIS — E782 Mixed hyperlipidemia: Secondary | ICD-10-CM | POA: Diagnosis not present

## 2018-05-26 DIAGNOSIS — R945 Abnormal results of liver function studies: Secondary | ICD-10-CM | POA: Diagnosis not present

## 2018-05-31 DIAGNOSIS — E119 Type 2 diabetes mellitus without complications: Secondary | ICD-10-CM | POA: Diagnosis not present

## 2018-05-31 DIAGNOSIS — M1990 Unspecified osteoarthritis, unspecified site: Secondary | ICD-10-CM | POA: Diagnosis not present

## 2018-05-31 DIAGNOSIS — I10 Essential (primary) hypertension: Secondary | ICD-10-CM | POA: Diagnosis not present

## 2018-05-31 DIAGNOSIS — E782 Mixed hyperlipidemia: Secondary | ICD-10-CM | POA: Diagnosis not present

## 2018-06-15 ENCOUNTER — Ambulatory Visit (INDEPENDENT_AMBULATORY_CARE_PROVIDER_SITE_OTHER): Payer: Medicare HMO | Admitting: Otolaryngology

## 2018-06-15 DIAGNOSIS — H903 Sensorineural hearing loss, bilateral: Secondary | ICD-10-CM | POA: Diagnosis not present

## 2018-06-22 DIAGNOSIS — E782 Mixed hyperlipidemia: Secondary | ICD-10-CM | POA: Diagnosis not present

## 2018-06-22 DIAGNOSIS — E119 Type 2 diabetes mellitus without complications: Secondary | ICD-10-CM | POA: Diagnosis not present

## 2018-06-22 DIAGNOSIS — I10 Essential (primary) hypertension: Secondary | ICD-10-CM | POA: Diagnosis not present

## 2018-06-23 DIAGNOSIS — E871 Hypo-osmolality and hyponatremia: Secondary | ICD-10-CM | POA: Diagnosis not present

## 2018-06-23 DIAGNOSIS — M816 Localized osteoporosis [Lequesne]: Secondary | ICD-10-CM | POA: Diagnosis not present

## 2018-06-23 DIAGNOSIS — M1990 Unspecified osteoarthritis, unspecified site: Secondary | ICD-10-CM | POA: Diagnosis not present

## 2018-06-23 DIAGNOSIS — E782 Mixed hyperlipidemia: Secondary | ICD-10-CM | POA: Diagnosis not present

## 2018-06-23 DIAGNOSIS — I10 Essential (primary) hypertension: Secondary | ICD-10-CM | POA: Diagnosis not present

## 2018-06-23 DIAGNOSIS — R945 Abnormal results of liver function studies: Secondary | ICD-10-CM | POA: Diagnosis not present

## 2018-06-23 DIAGNOSIS — M8580 Other specified disorders of bone density and structure, unspecified site: Secondary | ICD-10-CM | POA: Diagnosis not present

## 2018-06-23 DIAGNOSIS — E119 Type 2 diabetes mellitus without complications: Secondary | ICD-10-CM | POA: Diagnosis not present

## 2018-06-27 DIAGNOSIS — E119 Type 2 diabetes mellitus without complications: Secondary | ICD-10-CM | POA: Diagnosis not present

## 2018-06-27 DIAGNOSIS — E782 Mixed hyperlipidemia: Secondary | ICD-10-CM | POA: Diagnosis not present

## 2018-06-27 DIAGNOSIS — I10 Essential (primary) hypertension: Secondary | ICD-10-CM | POA: Diagnosis not present

## 2018-06-27 DIAGNOSIS — M1990 Unspecified osteoarthritis, unspecified site: Secondary | ICD-10-CM | POA: Diagnosis not present

## 2018-06-28 DIAGNOSIS — D696 Thrombocytopenia, unspecified: Secondary | ICD-10-CM | POA: Diagnosis not present

## 2018-06-28 DIAGNOSIS — M8580 Other specified disorders of bone density and structure, unspecified site: Secondary | ICD-10-CM | POA: Diagnosis not present

## 2018-06-28 DIAGNOSIS — E782 Mixed hyperlipidemia: Secondary | ICD-10-CM | POA: Diagnosis not present

## 2018-06-28 DIAGNOSIS — M1990 Unspecified osteoarthritis, unspecified site: Secondary | ICD-10-CM | POA: Diagnosis not present

## 2018-06-28 DIAGNOSIS — R69 Illness, unspecified: Secondary | ICD-10-CM | POA: Diagnosis not present

## 2018-06-28 DIAGNOSIS — M5440 Lumbago with sciatica, unspecified side: Secondary | ICD-10-CM | POA: Diagnosis not present

## 2018-06-28 DIAGNOSIS — R945 Abnormal results of liver function studies: Secondary | ICD-10-CM | POA: Diagnosis not present

## 2018-06-28 DIAGNOSIS — I10 Essential (primary) hypertension: Secondary | ICD-10-CM | POA: Diagnosis not present

## 2018-06-28 DIAGNOSIS — E871 Hypo-osmolality and hyponatremia: Secondary | ICD-10-CM | POA: Diagnosis not present

## 2018-06-28 DIAGNOSIS — E6609 Other obesity due to excess calories: Secondary | ICD-10-CM | POA: Diagnosis not present

## 2018-06-28 DIAGNOSIS — E1169 Type 2 diabetes mellitus with other specified complication: Secondary | ICD-10-CM | POA: Diagnosis not present

## 2018-10-04 DIAGNOSIS — I10 Essential (primary) hypertension: Secondary | ICD-10-CM | POA: Diagnosis not present

## 2018-10-04 DIAGNOSIS — E782 Mixed hyperlipidemia: Secondary | ICD-10-CM | POA: Diagnosis not present

## 2018-11-20 DIAGNOSIS — I10 Essential (primary) hypertension: Secondary | ICD-10-CM | POA: Diagnosis not present

## 2018-11-20 DIAGNOSIS — E782 Mixed hyperlipidemia: Secondary | ICD-10-CM | POA: Diagnosis not present

## 2018-11-20 DIAGNOSIS — E119 Type 2 diabetes mellitus without complications: Secondary | ICD-10-CM | POA: Diagnosis not present

## 2018-11-20 DIAGNOSIS — E1169 Type 2 diabetes mellitus with other specified complication: Secondary | ICD-10-CM | POA: Diagnosis not present

## 2018-11-22 DIAGNOSIS — M5440 Lumbago with sciatica, unspecified side: Secondary | ICD-10-CM | POA: Diagnosis not present

## 2018-11-22 DIAGNOSIS — E871 Hypo-osmolality and hyponatremia: Secondary | ICD-10-CM | POA: Diagnosis not present

## 2018-11-22 DIAGNOSIS — M8580 Other specified disorders of bone density and structure, unspecified site: Secondary | ICD-10-CM | POA: Diagnosis not present

## 2018-11-22 DIAGNOSIS — R945 Abnormal results of liver function studies: Secondary | ICD-10-CM | POA: Diagnosis not present

## 2018-11-22 DIAGNOSIS — E1169 Type 2 diabetes mellitus with other specified complication: Secondary | ICD-10-CM | POA: Diagnosis not present

## 2018-11-22 DIAGNOSIS — E6609 Other obesity due to excess calories: Secondary | ICD-10-CM | POA: Diagnosis not present

## 2018-11-22 DIAGNOSIS — E782 Mixed hyperlipidemia: Secondary | ICD-10-CM | POA: Diagnosis not present

## 2018-11-22 DIAGNOSIS — M1990 Unspecified osteoarthritis, unspecified site: Secondary | ICD-10-CM | POA: Diagnosis not present

## 2018-11-22 DIAGNOSIS — D696 Thrombocytopenia, unspecified: Secondary | ICD-10-CM | POA: Diagnosis not present

## 2018-11-22 DIAGNOSIS — I10 Essential (primary) hypertension: Secondary | ICD-10-CM | POA: Diagnosis not present

## 2018-11-27 ENCOUNTER — Other Ambulatory Visit: Payer: Self-pay

## 2018-11-27 MED ORDER — ATORVASTATIN CALCIUM 40 MG PO TABS: 40.0000 mg | ORAL_TABLET | Freq: Every day | ORAL | 3 refills | Status: DC

## 2018-11-27 NOTE — Telephone Encounter (Signed)
Refilled atorvastatin 40 mg per fax request to [facility]

## 2018-12-07 DIAGNOSIS — M8580 Other specified disorders of bone density and structure, unspecified site: Secondary | ICD-10-CM | POA: Diagnosis not present

## 2018-12-07 DIAGNOSIS — M2550 Pain in unspecified joint: Secondary | ICD-10-CM | POA: Diagnosis not present

## 2018-12-07 DIAGNOSIS — E669 Obesity, unspecified: Secondary | ICD-10-CM | POA: Diagnosis not present

## 2018-12-07 DIAGNOSIS — Z6831 Body mass index (BMI) 31.0-31.9, adult: Secondary | ICD-10-CM | POA: Diagnosis not present

## 2018-12-07 DIAGNOSIS — M150 Primary generalized (osteo)arthritis: Secondary | ICD-10-CM | POA: Diagnosis not present

## 2019-02-01 ENCOUNTER — Encounter: Payer: Self-pay | Admitting: Orthopaedic Surgery

## 2019-02-01 ENCOUNTER — Ambulatory Visit: Payer: Medicare HMO | Admitting: Orthopaedic Surgery

## 2019-02-01 ENCOUNTER — Other Ambulatory Visit: Payer: Self-pay

## 2019-02-01 VITALS — BP 134/79 | HR 73 | Temp 95.7°F | Ht 62.0 in | Wt 167.0 lb

## 2019-02-01 DIAGNOSIS — M6530 Trigger finger, unspecified finger: Secondary | ICD-10-CM

## 2019-02-01 NOTE — Progress Notes (Signed)
Subjective:    Patient ID: Brenda Yoder, female    DOB: [birth year], 73 y.o.   MRN: [id number]  HPI She has developed triggering of the right dominant long finger over the last several months.  She has more and more triggering and now locking of the finger.  It is slightly painful but more of a problem as it limits use of the hand.  She has no redness,no swelling, no numbness.  She has to use her other hand to help extend the long finger at times.   Review of Systems  Constitutional: Positive for activity change.  Musculoskeletal: Positive for arthralgias.  All other systems reviewed and are negative.  For Review of Systems, all other systems reviewed and are negative.  The following is a summary of the past history medically, past history surgically, known current medicines, social history and family history.  This information is gathered electronically by the computer from prior information and documentation.  I review this each visit and have found including this information at this point in the chart is beneficial and informative.   Past Medical History:  Diagnosis Date  . Coronary atherosclerosis    a. 10/2017: Moderate 2V CAD with heavily calcified mid LAD (50%) and small non-dominant prox RCA (50%), normal LVEDP, with recommendation to treat medically.   Marland Kitchen GERD (gastroesophageal reflux disease)   . Headache(784.0)   . History of breast cancer   . History of thrombocytopenia   . Hypertension   . Osteoarthritis   . Type 2 diabetes mellitus ([place])     Past Surgical History:  Procedure Laterality Date  . ABDOMINAL HYSTERECTOMY    . CATARACT EXTRACTION W/PHACO Left 04/01/2015   Procedure: CATARACT EXTRACTION PHACO AND INTRAOCULAR LENS PLACEMENT (IOC);  Surgeon: Williams Che, MD;  Location: [facility];  Service: Ophthalmology;  Laterality: Left;  CDE 1.11  . CATARACT EXTRACTION W/PHACO Right 06/21/2016   Procedure: CATARACT EXTRACTION PHACO AND INTRAOCULAR LENS PLACEMENT ; CDE:   3.90;  Surgeon: Williams Che, MD;  Location: [facility];  Service: Ophthalmology;  Laterality: Right;  . CHOLECYSTECTOMY    . COLONOSCOPY  10/26/2012   Procedure: COLONOSCOPY;  Surgeon: Rogene Houston, MD;  [facility];  Service: Endoscopy;  Laterality: N/A;  830  . JOINT REPLACEMENT     lt knee  . LEFT HEART CATH AND CORONARY ANGIOGRAPHY N/A 10/31/2017   Procedure: LEFT HEART CATH AND CORONARY ANGIOGRAPHY;  Surgeon: Sherren Mocha, MD;  Location: [facility];  Service: Cardiovascular;  Laterality: N/A;  . LEFT HEART CATHETERIZATION WITH CORONARY ANGIOGRAM N/A 05/02/2012   Procedure: LEFT HEART CATHETERIZATION WITH CORONARY ANGIOGRAM;  Surgeon: Hillary Bow, MD;  Location: [facility];  Service: Cardiovascular;  Laterality: N/A;  . MASS EXCISION  02/23/2012   Procedure: EXCISION MASS;  Surgeon: Wynonia Sours, MD;  Location: [place];  Service: Orthopedics;  Laterality: Right;  right forearm  . MASTECTOMY     bil  . TONSILLECTOMY      Current Outpatient Medications on File Prior to Visit  Medication Sig Dispense Refill  . acyclovir (ZOVIRAX) 400 MG tablet Take 400 mg by mouth 3 (three) times daily as needed (blisters).     Marland Kitchen aspirin EC 81 MG tablet Take 81 mg by mouth daily.    Marland Kitchen atorvastatin (LIPITOR) 40 MG tablet Take 1 tablet (40 mg total) by mouth daily. 90 tablet 3  . Calcium Carb-Vit D-C-E-Mineral (OS-CAL ULTRA) 600 MG TABS  Take 600 mg by mouth 2 (two) times daily.    . DULoxetine (CYMBALTA) 30 MG capsule Take 30 mg by mouth 2 (two) times daily.     . Empagliflozin-Metformin HCl (SYNJARDY) 12.01-999 MG TABS Take 1 tablet by mouth 2 (two) times daily.    . famotidine (PEPCID) 20 MG tablet Take 20 mg by mouth daily as needed for heartburn or indigestion.    Marland Kitchen HYDROcodone-acetaminophen (NORCO/VICODIN) 5-325 MG tablet     . lisinopril (ZESTRIL) 20 MG tablet Take 20 mg by mouth daily.    . metoprolol (LOPRESSOR) 25 MG tablet Take 1 tablet (25 mg total)  by mouth 2 (two) times daily. 60 tablet 6  . nitroGLYCERIN (NITROLINGUAL) 0.4 MG/SPRAY spray Place 1 spray under the tongue every 5 (five) minutes x 3 doses as needed for chest pain. 12 g 12  . SUMAtriptan (IMITREX) 20 MG/ACT nasal spray Place 1 spray into the nose every 2 (two) hours as needed for migraine.     Marland Kitchen zolpidem (AMBIEN) 10 MG tablet Take 10 mg by mouth at bedtime as needed for sleep.     Marland Kitchen liraglutide (VICTOZA) 18 MG/3ML SOPN Inject into the skin.    Marland Kitchen ondansetron (ZOFRAN) 8 MG tablet Take 8 mg by mouth every 8 (eight) hours as needed for nausea or vomiting.     No current facility-administered medications on file prior to visit.     Social History   Socioeconomic History  . Marital status: Widowed    Spouse name: Not on file  . Number of children: Not on file  . Years of education: Not on file  . Highest education level: Not on file  Occupational History  . Not on file  Social Needs  . Financial resource strain: Not on file  . Food insecurity:    Worry: Not on file    Inability: Not on file  . Transportation needs:    Medical: Not on file    Non-medical: Not on file  Tobacco Use  . Smoking status: Former Smoker    Packs/day: 0.50    Years: 12.00    Pack years: 6.00    Types: Cigarettes    Last attempt to quit: 09/27/1977    Years since quitting: 41.3  . Smokeless tobacco: Never Used  Substance and Sexual Activity  . Alcohol use: Yes    Comment: social  . Drug use: No  . Sexual activity: Never    Birth control/protection: None  Lifestyle  . Physical activity:    Days per week: Not on file    Minutes per session: Not on file  . Stress: Not on file  Relationships  . Social connections:    Talks on phone: Not on file    Gets together: Not on file    Attends religious service: Not on file    Active member of club or organization: Not on file    Attends meetings of clubs or organizations: Not on file    Relationship status: Not on file  . Intimate partner  violence:    Fear of current or ex partner: Not on file    Emotionally abused: Not on file    Physically abused: Not on file    Forced sexual activity: Not on file  Other Topics Concern  . Not on file  Social History Narrative  . Not on file    Family History  Problem Relation Age of Onset  . Cancer Mother   . Heart disease Father  BP 134/79   Pulse 73   Temp (!) 95.7 F (35.4 C)   Ht 5\' 2"  (1.575 m)   Wt 167 lb (75.8 kg)   BMI 30.54 kg/m   Body mass index is 30.54 kg/m.      Objective:   Physical Exam Vitals signs reviewed.  Constitutional:      Appearance: She is well-developed.  HENT:     Head: Normocephalic and atraumatic.  Eyes:     Conjunctiva/sclera: Conjunctivae normal.     Pupils: Pupils are equal, round, and reactive to light.  Neck:     Musculoskeletal: Normal range of motion and neck supple.  Cardiovascular:     Rate and Rhythm: Normal rate and regular rhythm.  Pulmonary:     Effort: Pulmonary effort is normal.  Abdominal:     Palpations: Abdomen is soft.  Musculoskeletal:     Right hand: She exhibits tenderness.       Hands:  Skin:    General: Skin is warm and dry.  Neurological:     Mental Status: She is alert and oriented to person, place, and time.     Cranial Nerves: No cranial nerve deficit.     Motor: No abnormal muscle tone.     Coordination: Coordination normal.     Deep Tendon Reflexes: Reflexes are normal and symmetric. Reflexes normal.  Psychiatric:        Behavior: Behavior normal.        Thought Content: Thought content normal.        Judgment: Judgment normal.           Assessment & Plan:   Encounter Diagnosis  Name Primary?  . Trigger finger, acquired Yes   I have explained the finding to her.  I will do injection today but she may need surgery to release the A1 pulley.  Procedure note: PROCEDURE  Trigger Finger Injection  The right Middle finger  has been locking at the A1 pulley.  The patient has  been told about injection of the digit.  Surgical correction and excision of the A1 pulley will resolve the problem.  Ani injection in the digit should help but the results may be short lived.  The patient asked appropriate questions and understands the procedure.  The patient has elected for an injection at this time.  Verbal consent was obtained.  A timeout was taken to confirm the proper hand and digit.  Medication  1 mL of 40 mg Depo-Medrol  2 mL of 1% lidocaine plain  Ethyl chloride for anesthesia  Alcohol was used to prepare the skin along with ethyl chloride and then the injection was made at the A1 pulley there were no complications.  It was tolerated well.  A Band-aid dressing was applied.  Call if any problem or difficulty.  Electronically Signed Sanjuana Kava, MD 5/7/202010:38 AM

## 2019-06-07 DIAGNOSIS — R69 Illness, unspecified: Secondary | ICD-10-CM | POA: Diagnosis not present

## 2019-06-12 DIAGNOSIS — E782 Mixed hyperlipidemia: Secondary | ICD-10-CM | POA: Diagnosis not present

## 2019-06-12 DIAGNOSIS — E1169 Type 2 diabetes mellitus with other specified complication: Secondary | ICD-10-CM | POA: Diagnosis not present

## 2019-06-12 DIAGNOSIS — E119 Type 2 diabetes mellitus without complications: Secondary | ICD-10-CM | POA: Diagnosis not present

## 2019-06-12 DIAGNOSIS — I10 Essential (primary) hypertension: Secondary | ICD-10-CM | POA: Diagnosis not present

## 2019-06-14 DIAGNOSIS — E6609 Other obesity due to excess calories: Secondary | ICD-10-CM | POA: Diagnosis not present

## 2019-06-14 DIAGNOSIS — I10 Essential (primary) hypertension: Secondary | ICD-10-CM | POA: Diagnosis not present

## 2019-06-14 DIAGNOSIS — E782 Mixed hyperlipidemia: Secondary | ICD-10-CM | POA: Diagnosis not present

## 2019-06-14 DIAGNOSIS — M8580 Other specified disorders of bone density and structure, unspecified site: Secondary | ICD-10-CM | POA: Diagnosis not present

## 2019-06-14 DIAGNOSIS — E871 Hypo-osmolality and hyponatremia: Secondary | ICD-10-CM | POA: Diagnosis not present

## 2019-06-14 DIAGNOSIS — R945 Abnormal results of liver function studies: Secondary | ICD-10-CM | POA: Diagnosis not present

## 2019-06-14 DIAGNOSIS — M5440 Lumbago with sciatica, unspecified side: Secondary | ICD-10-CM | POA: Diagnosis not present

## 2019-06-14 DIAGNOSIS — D696 Thrombocytopenia, unspecified: Secondary | ICD-10-CM | POA: Diagnosis not present

## 2019-06-14 DIAGNOSIS — E1169 Type 2 diabetes mellitus with other specified complication: Secondary | ICD-10-CM | POA: Diagnosis not present

## 2019-06-14 DIAGNOSIS — M1990 Unspecified osteoarthritis, unspecified site: Secondary | ICD-10-CM | POA: Diagnosis not present

## 2019-06-28 ENCOUNTER — Ambulatory Visit (INDEPENDENT_AMBULATORY_CARE_PROVIDER_SITE_OTHER): Payer: Medicare HMO | Admitting: Otolaryngology

## 2019-08-07 ENCOUNTER — Other Ambulatory Visit: Payer: Self-pay

## 2019-08-07 DIAGNOSIS — Z20822 Contact with and (suspected) exposure to covid-19: Secondary | ICD-10-CM

## 2019-08-10 ENCOUNTER — Ambulatory Visit
Admission: EM | Admit: 2019-08-10 | Discharge: 2019-08-10 | Disposition: A | Discharge status: Home / Self Care | Payer: Medicare HMO | Attending: Internal Medicine | Admitting: Internal Medicine

## 2019-08-10 ENCOUNTER — Ambulatory Visit (INDEPENDENT_AMBULATORY_CARE_PROVIDER_SITE_OTHER): Payer: Medicare HMO

## 2019-08-10 ENCOUNTER — Ambulatory Visit: Payer: Medicare HMO

## 2019-08-10 ENCOUNTER — Other Ambulatory Visit: Payer: Self-pay

## 2019-08-10 DIAGNOSIS — M25561 Pain in right knee: Secondary | ICD-10-CM

## 2019-08-10 DIAGNOSIS — M1711 Unilateral primary osteoarthritis, right knee: Secondary | ICD-10-CM | POA: Diagnosis not present

## 2019-08-10 LAB — NOVEL CORONAVIRUS, NAA: SARS-CoV-2, NAA: NOT DETECTED

## 2019-08-10 MED ORDER — PREDNISONE 10 MG PO TABS: ORAL_TABLET | ORAL | 0 refills | Status: AC

## 2019-08-10 NOTE — ED Triage Notes (Signed)
Pt presents to UC w/ c/o right knee pain since 8 days. Pt states aching pain shoots down leg and up to thigh. Pt states it disturbs sleep. Pt states ibuprofen, voltaren, and ice do not help.

## 2019-08-10 NOTE — Discharge Instructions (Signed)
Xray showing moderate degenerative disease, no acute bony abnormality Begin prednisone taper, beginning with 6 tabs, decrease by 1 tab daily until you take 1 tab on day 6 Continue resting, elevating and icing  Follow up as planned on Tuesday with ortho  If developing stiffness, swelling, redness, fever please follow up sooner

## 2019-08-10 NOTE — ED Provider Notes (Signed)
[facility]    CSN: [id number] Arrival date & time: 08/10/19  1314      History   Chief Complaint Chief Complaint  Patient presents with  . right knee pain    HPI Brenda Yoder is a 73 y.o. female history of hypertension, GERD, DM type II, osteoarthritis presenting today for evaluation of right knee pain.  Patient states that she has had knee pain for the past 8 days.  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  Knee begin taking approximately 3 hours after.  She denies any fall trauma or direct blow to the knee.  States she has been icing, elevating, using Voltaren without relief.  Has a left TKA, known arthritis right knee.  Has plans to follow-up with Dr. Luna Glasgow with orthopedics on Tuesday.  HPI  Past Medical History:  Diagnosis Date  . Coronary atherosclerosis    a. 10/2017: Moderate 2V CAD with heavily calcified mid LAD (50%) and small non-dominant prox RCA (50%), normal LVEDP, with recommendation to treat medically.   Marland Kitchen GERD (gastroesophageal reflux disease)   . Headache(784.0)   . History of breast cancer   . History of thrombocytopenia   . Hypertension   . Osteoarthritis   . Type 2 diabetes mellitus [facility])     Patient Active Problem List   Diagnosis Date Noted  . Encounter for lipid screening for cardiovascular disease 11/28/2017  . Coronary artery disease with exertional angina ([place]) 10/31/2017  . Chest pain 05/05/2012  . Diabetes mellitus, type 2 ([place]) 05/05/2012  . Mixed hyperlipidemia 05/05/2012    Past Surgical History:  Procedure Laterality Date  . ABDOMINAL HYSTERECTOMY    . CATARACT EXTRACTION W/PHACO Left 04/01/2015   Procedure: CATARACT EXTRACTION PHACO AND INTRAOCULAR LENS PLACEMENT (IOC);  Surgeon: Williams Che, MD;  Location: [facility];  Service: Ophthalmology;  Laterality: Left;  CDE 1.11  . CATARACT EXTRACTION W/PHACO Right 06/21/2016   Procedure: CATARACT EXTRACTION PHACO AND INTRAOCULAR LENS PLACEMENT ; CDE:  3.90;  Surgeon: Williams Che, MD;  Location: [facility];  Service: Ophthalmology;  Laterality: Right;  . CHOLECYSTECTOMY    . COLONOSCOPY  10/26/2012   Procedure: COLONOSCOPY;  Surgeon: Rogene Houston, MD;  [facility];  Service: Endoscopy;  Laterality: N/A;  830  . JOINT REPLACEMENT     lt knee  . LEFT HEART CATH AND CORONARY ANGIOGRAPHY N/A 10/31/2017   Procedure: LEFT HEART CATH AND CORONARY ANGIOGRAPHY;  Surgeon: Sherren Mocha, MD;  Location: [place] [facility];  Service: Cardiovascular;  Laterality: N/A;  . LEFT HEART CATHETERIZATION WITH CORONARY ANGIOGRAM N/A 05/02/2012   Procedure: LEFT HEART CATHETERIZATION WITH CORONARY ANGIOGRAM;  Surgeon: Hillary Bow, MD;  Location: [facility] CATH LAB;  Service: Cardiovascular;  Laterality: N/A;  . MASS EXCISION  02/23/2012   Procedure: EXCISION MASS;  Surgeon: Wynonia Sours, MD;  Location: [place];  Service: Orthopedics;  Laterality: Right;  right forearm  . MASTECTOMY     bil  . TONSILLECTOMY      OB History   No obstetric history on file.      Home Medications    Prior to Admission medications   Medication Sig Start Date End Date Taking? Authorizing Provider  acyclovir (ZOVIRAX) 400 MG tablet Take 400 mg by mouth 3 (three) times daily as needed (blisters).     [provider]  aspirin EC 81 MG tablet Take 81 mg by mouth daily.    [provider]  atorvastatin (LIPITOR) 40 MG tablet Take 1 tablet (40 mg total) by mouth daily. 11/27/18 02/25/19  Satira Sark, MD  Calcium Carb-Vit D-C-E-Mineral (OS-CAL ULTRA) 600 MG TABS Take 600 mg by mouth 2 (two) times daily.    [provider]  DULoxetine (CYMBALTA) 30 MG capsule Take 30 mg by mouth 2 (two) times daily.     [provider]  Empagliflozin-Metformin HCl (SYNJARDY) 12.01-999 MG TABS Take 1 tablet by mouth 2 (two) times daily.    [provider]  famotidine (PEPCID) 20 MG tablet Take 20 mg by mouth daily as needed for heartburn or indigestion.    [provider]  lisinopril (ZESTRIL) 20 MG tablet Take 20 mg by mouth daily. 12/18/18   [provider]  nitroGLYCERIN (NITROLINGUAL) 0.4 MG/SPRAY spray Place 1 spray under the tongue every 5 (five) minutes x 3 doses as needed for chest pain. 11/29/17   Dunn, Dayna N, PA-C  ondansetron (ZOFRAN) 8 MG tablet Take 8 mg by mouth every 8 (eight) hours as needed for nausea or vomiting.    [provider]  predniSONE (DELTASONE) 10 MG tablet Begin with 6 tabs on day 1, 5 tab on day 2, 4 tab on day 3, 3 tab on day 4, 2 tab on day 5, 1 tab on day 6 08/10/19   Daishon Chui, [employer] C, PA-C  SUMAtriptan (IMITREX) 20 MG/ACT nasal spray Place 1 spray into the nose every 2 (two) hours as needed for migraine.     [provider]  zolpidem (AMBIEN) 10 MG tablet Take 10 mg by mouth at bedtime as needed for sleep.     [provider]  liraglutide (VICTOZA) 18 MG/3ML SOPN Inject into the skin.  08/10/19  [provider]  metoprolol (LOPRESSOR) 25 MG tablet Take 1 tablet (25 mg total) by mouth 2 (two) times daily. 05/02/12 08/10/19  Hope, Leona Carry, PA-C    Family History Family History  Problem Relation Age of Onset  . Cancer Mother   . Heart disease Father     Social History Social History   Tobacco Use  . Smoking status: Former Smoker    Packs/day: 0.50    Years: 12.00    Pack years: 6.00    Types: Cigarettes    Quit date: 09/27/1977    Years since quitting: 41.8  . Smokeless tobacco: Never Used  Substance Use Topics  . Alcohol use: Yes    Comment: social  . Drug use: No     Allergies   Patient has no known allergies.   Review of Systems Review of Systems  Constitutional: Negative for fatigue and fever.  Eyes: Negative for visual disturbance.  Respiratory: Negative for shortness of breath.   Cardiovascular: Negative for chest pain.   Gastrointestinal: Negative for abdominal pain, nausea and vomiting.  Musculoskeletal: Positive for arthralgias and gait problem. Negative for joint swelling.  Skin: Negative for color change, rash and wound.  Neurological: Negative for dizziness, weakness, light-headedness and headaches.     Physical Exam Triage Vital Signs ED Triage Vitals  Enc Vitals Group     BP 08/10/19 1412 (!) 143/88     Pulse Rate 08/10/19 1412 77     Resp 08/10/19 1412 16     Temp 08/10/19 1412 98.2 F (36.8 C)     Temp Source 08/10/19 1412 Oral  SpO2 08/10/19 1412 96 %     Weight --      Height --      Head Circumference --      Peak Flow --      Pain Score 08/10/19 1321 8     Pain Loc --      Pain Edu? --      Excl. in [place]? --    No data found.  Updated Vital Signs BP (!) 143/88 (BP Location: Right Arm)   Pulse 77   Temp 98.2 F (36.8 C) (Oral)   Resp 16   SpO2 96%   Visual Acuity Right Eye Distance:   Left Eye Distance:   Bilateral Distance:    Right Eye Near:   Left Eye Near:    Bilateral Near:     Physical Exam Vitals signs and nursing note reviewed.  Constitutional:      Appearance: She is well-developed.     Comments: No acute distress  HENT:     Head: Normocephalic and atraumatic.     Nose: Nose normal.  Eyes:     Conjunctiva/sclera: Conjunctivae normal.  Neck:     Musculoskeletal: Neck supple.  Cardiovascular:     Rate and Rhythm: Normal rate.  Pulmonary:     Effort: Pulmonary effort is normal. No respiratory distress.  Abdominal:     General: There is no distension.  Musculoskeletal: Normal range of motion.     Comments: Right knee: No obvious deformity swelling or discoloration, mild tenderness to bilateral joint lines, nontender directly over patella, nontender to popliteal area, no calf tenderness.  No laxity appreciated with varus or valgus stress.  Negative Lachman.  Mild antalgia with gait  No lower leg swelling or erythema  Skin:    General: Skin is warm  and dry.  Neurological:     Mental Status: She is alert and oriented to person, place, and time.      UC Treatments / Results  Labs (all labs ordered are listed, but only abnormal results are displayed) Labs Reviewed - No data to display  EKG   Radiology Dg Knee Ap/lat W/sunrise Right  Result Date: 08/10/2019 CLINICAL DATA:  Acute right knee pain. EXAM: RIGHT KNEE 3 VIEWS COMPARISON:  None. FINDINGS: No evidence of fracture, dislocation, or joint effusion. Medial and lateral joint spaces are unremarkable. Moderate narrowing of patellofemoral space is noted. Soft tissues are unremarkable. IMPRESSION: Moderate degenerative joint disease is seen involving patellofemoral space. No acute abnormality is noted in the right knee. Electronically Signed   By: Marijo Conception M.D.   On: 08/10/2019 14:06    Procedures Procedures (including critical care time)  Medications Ordered in UC Medications - No data to display  Initial Impression / Assessment and Plan / UC Course  I have reviewed the triage vital signs and the nursing notes.  Pertinent labs & imaging results that were available during my care of the patient were reviewed by me and considered in my medical decision making (see chart for details).     X-ray showing moderate degenerative changes, no acute bony abnormality.  Given patient has been failing NSAIDs, will switch to prednisone taper.  States last A1c was 6.8.  Continue resting icing and elevation, follow-up with Ortho as planned.Discussed strict return precautions. Patient verbalized understanding and is agreeable with plan.  Final Clinical Impressions(s) / UC Diagnoses   Final diagnoses:  Acute pain of right knee     Discharge Instructions  Xray showing moderate degenerative disease, no acute bony abnormality Begin prednisone taper, beginning with 6 tabs, decrease by 1 tab daily until you take 1 tab on day 6 Continue resting, elevating and icing  Follow up as  planned on Tuesday with ortho  If developing stiffness, swelling, redness, fever please follow up sooner   ED Prescriptions    Medication Sig Dispense Auth. Provider   predniSONE (DELTASONE) 10 MG tablet Begin with 6 tabs on day 1, 5 tab on day 2, 4 tab on day 3, 3 tab on day 4, 2 tab on day 5, 1 tab on day 6 21 tablet [employer] [place] C, PA-C     I have reviewed the PDMP during this encounter.   Janith Lima, [place] 08/10/19 1519

## 2019-08-14 ENCOUNTER — Encounter: Payer: Self-pay | Admitting: Orthopaedic Surgery

## 2019-08-14 ENCOUNTER — Ambulatory Visit: Payer: Medicare HMO | Admitting: Orthopaedic Surgery

## 2019-08-14 ENCOUNTER — Other Ambulatory Visit: Payer: Self-pay

## 2019-08-14 VITALS — BP 154/77 | HR 72 | Temp 96.6°F | Ht 62.0 in | Wt 177.0 lb

## 2019-08-14 DIAGNOSIS — G8929 Other chronic pain: Secondary | ICD-10-CM

## 2019-08-14 DIAGNOSIS — M25561 Pain in right knee: Secondary | ICD-10-CM

## 2019-08-14 NOTE — Progress Notes (Signed)
Subjective:    Patient ID: Brenda Yoder, female    DOB: Mar 03, 1946, 73 y.o.   MRN: [id number]  HPI She was moving some furniture about ten days ago. She experienced pain in the right knee. She did not fall or twist the knee.  The pain got worse.  She had deep pain.  She was unable to stand on it.  She iced it, took diclofenac, rested.  It has gotten worse and better intermittently.  She went to the Urgent Care on 08-10-2019.  X-rays were done but were negative for acute injury.  She has gotten a little better after a steroid dose pack but it is wearing off now. She has no giving way. She has been unable to lay down with the knee straight and had to sleep a recliner until last night.  She has used a cane for a day or two. She has a total knee on the left side.  I am concerned about a meniscus tear.  I will order a MRI.   Review of Systems  Constitutional: Positive for activity change.  Musculoskeletal: Positive for arthralgias, gait problem and joint swelling.  All other systems reviewed and are negative.  For Review of Systems, all other systems reviewed and are negative.  The following is a summary of the past history medically, past history surgically, known current medicines, social history and family history.  This information is gathered electronically by the computer from prior information and documentation.  I review this each visit and have found including this information at this point in the chart is beneficial and informative.   Past Medical History:  Diagnosis Date  . Coronary atherosclerosis    a. 10/2017: Moderate 2V CAD with heavily calcified mid LAD (50%) and small non-dominant prox RCA (50%), normal LVEDP, with recommendation to treat medically.   Marland Kitchen GERD (gastroesophageal reflux disease)   . Headache(784.0)   . History of breast cancer   . History of thrombocytopenia   . Hypertension   . Osteoarthritis   . Type 2 diabetes mellitus ([place])     Past Surgical History:   Procedure Laterality Date  . ABDOMINAL HYSTERECTOMY    . CATARACT EXTRACTION W/PHACO Left 04/01/2015   Procedure: CATARACT EXTRACTION PHACO AND INTRAOCULAR LENS PLACEMENT (IOC);  Surgeon: Williams Che, MD;  Location: [facility];  Service: Ophthalmology;  Laterality: Left;  CDE 1.11  . CATARACT EXTRACTION W/PHACO Right 06/21/2016   Procedure: CATARACT EXTRACTION PHACO AND INTRAOCULAR LENS PLACEMENT ; CDE:  3.90;  Surgeon: Williams Che, MD;  Location: [facility];  Service: Ophthalmology;  Laterality: Right;  . CHOLECYSTECTOMY    . COLONOSCOPY  10/26/2012   Procedure: COLONOSCOPY;  Surgeon: Rogene Houston, MD;  Location: [facility];  Service: Endoscopy;  Laterality: N/A;  830  . JOINT REPLACEMENT     lt knee  . LEFT HEART CATH AND CORONARY ANGIOGRAPHY N/A 10/31/2017   Procedure: LEFT HEART CATH AND CORONARY ANGIOGRAPHY;  Surgeon: Sherren Mocha, MD;  Location: [facility];  Service: Cardiovascular;  Laterality: N/A;  . LEFT HEART CATHETERIZATION WITH CORONARY ANGIOGRAM N/A 05/02/2012   Procedure: LEFT HEART CATHETERIZATION WITH CORONARY ANGIOGRAM;  Surgeon: Hillary Bow, MD;  Location: [facility] CATH LAB;  Service: Cardiovascular;  Laterality: N/A;  . MASS EXCISION  02/23/2012   Procedure: EXCISION MASS;  Surgeon: Wynonia Sours, MD;  Location: [facility];  Service: Orthopedics;  Laterality: Right;  right forearm  . MASTECTOMY  bil  . TONSILLECTOMY      Current Outpatient Medications on File Prior to Visit  Medication Sig Dispense Refill  . acyclovir (ZOVIRAX) 400 MG tablet Take 400 mg by mouth 3 (three) times daily as needed (blisters).     Marland Kitchen aspirin EC 81 MG tablet Take 81 mg by mouth daily.    Marland Kitchen atorvastatin (LIPITOR) 40 MG tablet Take 1 tablet (40 mg total) by mouth daily. 90 tablet 3  . Calcium Carb-Vit D-C-E-Mineral (OS-CAL ULTRA) 600 MG TABS Take 600 mg by mouth 2 (two) times daily.    . DULoxetine (CYMBALTA) 30 MG capsule Take 30 mg by mouth 2 (two) times daily.      . Empagliflozin-Metformin HCl (SYNJARDY) 12.01-999 MG TABS Take 1 tablet by mouth 2 (two) times daily.    . famotidine (PEPCID) 20 MG tablet Take 20 mg by mouth daily as needed for heartburn or indigestion.    Marland Kitchen lisinopril (ZESTRIL) 20 MG tablet Take 20 mg by mouth daily.    . nitroGLYCERIN (NITROLINGUAL) 0.4 MG/SPRAY spray Place 1 spray under the tongue every 5 (five) minutes x 3 doses as needed for chest pain. 12 g 12  . ondansetron (ZOFRAN) 8 MG tablet Take 8 mg by mouth every 8 (eight) hours as needed for nausea or vomiting.    . predniSONE (DELTASONE) 10 MG tablet Begin with 6 tabs on day 1, 5 tab on day 2, 4 tab on day 3, 3 tab on day 4, 2 tab on day 5, 1 tab on day 6 21 tablet 0  . SUMAtriptan (IMITREX) 20 MG/ACT nasal spray Place 1 spray into the nose every 2 (two) hours as needed for migraine.     Marland Kitchen zolpidem (AMBIEN) 10 MG tablet Take 10 mg by mouth at bedtime as needed for sleep.     . [DISCONTINUED] liraglutide (VICTOZA) 18 MG/3ML SOPN Inject into the skin.    . [DISCONTINUED] metoprolol (LOPRESSOR) 25 MG tablet Take 1 tablet (25 mg total) by mouth 2 (two) times daily. 60 tablet 6   No current facility-administered medications on file prior to visit.     Social History   Socioeconomic History  . Marital status: Widowed    Spouse name: Not on file  . Number of children: Not on file  . Years of education: Not on file  . Highest education level: Not on file  Occupational History  . Not on file  Social Needs  . Financial resource strain: Not on file  . Food insecurity    Worry: Not on file    Inability: Not on file  . Transportation needs    Medical: Not on file    Non-medical: Not on file  Tobacco Use  . Smoking status: Former Smoker    Packs/day: 0.50    Years: 12.00    Pack years: 6.00    Types: Cigarettes    Quit date: 09/27/1977    Years since quitting: 41.9  . Smokeless tobacco: Never Used  Substance and Sexual Activity  . Alcohol use: Yes    Comment: social  .  Drug use: No  . Sexual activity: Never    Birth control/protection: None  Lifestyle  . Physical activity    Days per week: Not on file    Minutes per session: Not on file  . Stress: Not on file  Relationships  . Social Herbalist on phone: Not on file    Gets together: Not on file  Attends religious service: Not on file    Active member of club or organization: Not on file    Attends meetings of clubs or organizations: Not on file    Relationship status: Not on file  . Intimate partner violence    Fear of current or ex partner: Not on file    Emotionally abused: Not on file    Physically abused: Not on file    Forced sexual activity: Not on file  Other Topics Concern  . Not on file  Social History Narrative  . Not on file    Family History  Problem Relation Age of Onset  . Cancer Mother   . Heart disease Father     BP (!) 154/77   Pulse 72   Temp (!) 96.6 F (35.9 C)   Ht 5\' 2"  (1.575 m)   Wt 177 lb (80.3 kg)   BMI 32.37 kg/m   Body mass index is 32.37 kg/m.     Objective:   Physical Exam Vitals signs reviewed.  Constitutional:      Appearance: She is well-developed.  HENT:     Head: Normocephalic and atraumatic.  Eyes:     Conjunctiva/sclera: Conjunctivae normal.     Pupils: Pupils are equal, round, and reactive to light.  Neck:     Musculoskeletal: Normal range of motion and neck supple.  Cardiovascular:     Rate and Rhythm: Normal rate and regular rhythm.  Pulmonary:     Effort: Pulmonary effort is normal.  Abdominal:     Palpations: Abdomen is soft.  Musculoskeletal:     Right knee: She exhibits effusion. Tenderness found.       Legs:  Skin:    General: Skin is warm and dry.  Neurological:     Mental Status: She is alert and oriented to person, place, and time.     Cranial Nerves: No cranial nerve deficit.     Motor: No abnormal muscle tone.     Coordination: Coordination normal.     Deep Tendon Reflexes: Reflexes are normal  and symmetric. Reflexes normal.  Psychiatric:        Behavior: Behavior normal.        Thought Content: Thought content normal.        Judgment: Judgment normal.     I have reviewed the notes from Urgent care and the x-rays and report.      Assessment & Plan:   Encounter Diagnosis  Name Primary?  . Chronic pain of right knee Yes   PROCEDURE NOTE:  The patient requests injections of the right knee , verbal consent was obtained.  The right knee was prepped appropriately after time out was performed.   Sterile technique was observed and injection of 1 cc of Depo-Medrol 40 mg with several cc's of plain xylocaine. Anesthesia was provided by ethyl chloride and a 20-gauge needle was used to inject the knee area. The injection was tolerated well.  A band aid dressing was applied.  The patient was advised to apply ice later today and tomorrow to the injection sight as needed.  I will order a MRI.  I am concerned about a meniscus tear.  Continue NSAIDs.  Call if any problem.  Precautions discussed.   Electronically Signed Sanjuana Kava, MD 11/17/20208:30 AM

## 2019-08-22 DIAGNOSIS — Z20828 Contact with and (suspected) exposure to other viral communicable diseases: Secondary | ICD-10-CM | POA: Diagnosis not present

## 2019-08-31 ENCOUNTER — Ambulatory Visit (HOSPITAL_COMMUNITY)
Admission: RE | Admit: 2019-08-31 | Discharge: 2019-08-31 | Disposition: A | Discharge status: Home / Self Care | Payer: Medicare HMO | Source: Ambulatory Visit | Attending: Orthopaedic Surgery | Admitting: Orthopaedic Surgery

## 2019-08-31 ENCOUNTER — Other Ambulatory Visit: Payer: Self-pay

## 2019-08-31 DIAGNOSIS — G8929 Other chronic pain: Secondary | ICD-10-CM | POA: Insufficient documentation

## 2019-08-31 DIAGNOSIS — M25561 Pain in right knee: Secondary | ICD-10-CM | POA: Diagnosis not present

## 2019-09-04 ENCOUNTER — Encounter: Payer: Self-pay | Admitting: Orthopaedic Surgery

## 2019-09-04 ENCOUNTER — Ambulatory Visit (INDEPENDENT_AMBULATORY_CARE_PROVIDER_SITE_OTHER): Payer: Medicare HMO | Admitting: Orthopaedic Surgery

## 2019-09-04 ENCOUNTER — Other Ambulatory Visit: Payer: Self-pay

## 2019-09-04 VITALS — BP 153/89 | HR 79 | Temp 96.6°F | Ht 63.0 in | Wt 174.5 lb

## 2019-09-04 DIAGNOSIS — G8929 Other chronic pain: Secondary | ICD-10-CM

## 2019-09-04 DIAGNOSIS — M25561 Pain in right knee: Secondary | ICD-10-CM

## 2019-09-04 NOTE — Progress Notes (Signed)
Patient [id number] Brenda Yoder, female DOB:Aug 02, 1946, 73 y.o. [id number]  Chief Complaint  Patient presents with  . Knee Pain    R/doing better/pain scale 1-2    HPI  Brenda Yoder is a 73 y.o. female who has pain of the right knee.  Her pain is less after the injection but still hurts.  She had MRI which showed: IMPRESSION: 1. Radial tear of the posterior horn of the medial meniscus with peripheral meniscal extrusion. Degeneration of the posterior horn and posterior body of the medial meniscus. 2. Tricompartmental cartilage abnormalities as described above. 3. Moderate joint effusion.  I have explained the findings to her.  I will have her see Dr. Aline Brochure to talk about possible arthroscopy.  She will eventually need a total knee like on the left side.  She understands.  She is in the process of moving to [place], [place] and would like to have the total joint surgery there where family also lives.  She is using a cane, uses ice and ibuprofen.  Body mass index is 30.91 kg/m.  ROS  Review of Systems  Constitutional: Positive for activity change.  Musculoskeletal: Positive for arthralgias, gait problem and joint swelling.  All other systems reviewed and are negative.   All other systems reviewed and are negative.  The following is a summary of the past history medically, past history surgically, known current medicines, social history and family history.  This information is gathered electronically by the computer from prior information and documentation.  I review this each visit and have found including this information at this point in the chart is beneficial and informative.    Past Medical History:  Diagnosis Date  . Coronary atherosclerosis    a. 10/2017: Moderate 2V CAD with heavily calcified mid LAD (50%) and small non-dominant prox RCA (50%), normal LVEDP, with recommendation to treat medically.   Marland Kitchen GERD (gastroesophageal reflux disease)   . Headache(784.0)   . History  of breast cancer   . History of thrombocytopenia   . Hypertension   . Osteoarthritis   . Type 2 diabetes mellitus ([place])     Past Surgical History:  Procedure Laterality Date  . ABDOMINAL HYSTERECTOMY    . CATARACT EXTRACTION W/PHACO Left 04/01/2015   Procedure: CATARACT EXTRACTION PHACO AND INTRAOCULAR LENS PLACEMENT (IOC);  Surgeon: Williams Che, MD;  Location: [facility];  Service: Ophthalmology;  Laterality: Left;  CDE 1.11  . CATARACT EXTRACTION W/PHACO Right 06/21/2016   Procedure: CATARACT EXTRACTION PHACO AND INTRAOCULAR LENS PLACEMENT ; CDE:  3.90;  Surgeon: Williams Che, MD;  Location: [facility];  Service: Ophthalmology;  Laterality: Right;  . CHOLECYSTECTOMY    . COLONOSCOPY  10/26/2012   Procedure: COLONOSCOPY;  Surgeon: Rogene Houston, MD;  [facility];  Service: Endoscopy;  Laterality: N/A;  830  . JOINT REPLACEMENT     lt knee  . LEFT HEART CATH AND CORONARY ANGIOGRAPHY N/A 10/31/2017   Procedure: LEFT HEART CATH AND CORONARY ANGIOGRAPHY;  Surgeon: Sherren Mocha, MD;  Location: [facility];  Service: Cardiovascular;  Laterality: N/A;  . LEFT HEART CATHETERIZATION WITH CORONARY ANGIOGRAM N/A 05/02/2012   Procedure: LEFT HEART CATHETERIZATION WITH CORONARY ANGIOGRAM;  Surgeon: Hillary Bow, MD;  Location: [facility] CATH LAB;  Service: Cardiovascular;  Laterality: N/A;  . MASS EXCISION  02/23/2012   Procedure: EXCISION MASS;  Surgeon: Wynonia Sours, MD;  Location: [facility];  Service: Orthopedics;  Laterality: Right;  right forearm  .  MASTECTOMY     bil  . TONSILLECTOMY      Family History  Problem Relation Age of Onset  . Cancer Mother   . Heart disease Father     Social History Social History   Tobacco Use  . Smoking status: Former Smoker    Packs/day: 0.50    Years: 12.00    Pack years: 6.00    Types: Cigarettes    Quit date: 09/27/1977    Years since quitting: 41.9  . Smokeless tobacco: Never Used  Substance Use Topics  . Alcohol  use: Yes    Comment: social  . Drug use: No    No Known Allergies  Current Outpatient Medications  Medication Sig Dispense Refill  . acyclovir (ZOVIRAX) 400 MG tablet Take 400 mg by mouth 3 (three) times daily as needed (blisters).     Marland Kitchen aspirin EC 81 MG tablet Take 81 mg by mouth daily.    Marland Kitchen atorvastatin (LIPITOR) 40 MG tablet Take 1 tablet (40 mg total) by mouth daily. 90 tablet 3  . Calcium Carb-Vit D-C-E-Mineral (OS-CAL ULTRA) 600 MG TABS Take 600 mg by mouth 2 (two) times daily.    . DULoxetine (CYMBALTA) 30 MG capsule Take 30 mg by mouth 2 (two) times daily.     . Empagliflozin-Metformin HCl (SYNJARDY) 12.01-999 MG TABS Take 1 tablet by mouth 2 (two) times daily.    . famotidine (PEPCID) 20 MG tablet Take 20 mg by mouth daily as needed for heartburn or indigestion.    Marland Kitchen lisinopril (ZESTRIL) 20 MG tablet Take 20 mg by mouth daily.    . nitroGLYCERIN (NITROLINGUAL) 0.4 MG/SPRAY spray Place 1 spray under the tongue every 5 (five) minutes x 3 doses as needed for chest pain. 12 g 12  . ondansetron (ZOFRAN) 8 MG tablet Take 8 mg by mouth every 8 (eight) hours as needed for nausea or vomiting.    . predniSONE (DELTASONE) 10 MG tablet Begin with 6 tabs on day 1, 5 tab on day 2, 4 tab on day 3, 3 tab on day 4, 2 tab on day 5, 1 tab on day 6 21 tablet 0  . SUMAtriptan (IMITREX) 20 MG/ACT nasal spray Place 1 spray into the nose every 2 (two) hours as needed for migraine.     Marland Kitchen zolpidem (AMBIEN) 10 MG tablet Take 10 mg by mouth at bedtime as needed for sleep.      No current facility-administered medications for this visit.      Physical Exam  Blood pressure (!) 153/89, pulse 79, temperature (!) 96.6 F (35.9 C), height 5\' 3"  (1.6 m), weight 174 lb 8 oz (79.2 kg).  Constitutional: overall normal hygiene, normal nutrition, well developed, normal grooming, normal body habitus. Assistive device:cane  Musculoskeletal: gait and station Limp right, muscle tone and strength are normal, no  tremors or atrophy is present.  .  Neurological: coordination overall normal.  Deep tendon reflex/nerve stretch intact.  Sensation normal.  Cranial nerves II-XII intact.   Skin:   Normal overall no scars, lesions, ulcers or rashes. No psoriasis.  Psychiatric: Alert and oriented x 3.  Recent memory intact, remote memory unclear.  Normal mood and affect. Well groomed.  Good eye contact.  Cardiovascular: overall no swelling, no varicosities, no edema bilaterally, normal temperatures of the legs and arms, no clubbing, cyanosis and good capillary refill.  Lymphatic: palpation is normal.  Right knee with slight effusion, crepitus, ROM 0 to 105, limp right, NV intact.  All other systems reviewed and are negative   The patient has been educated about the nature of the problem(s) and counseled on treatment options.  The patient appeared to understand what I have discussed and is in agreement with it.  Encounter Diagnosis  Name Primary?  . Chronic pain of right knee Yes    PLAN Call if any problems.  Precautions discussed.  Continue current medications.   Return to clinic to see Dr. Aline Brochure   Electronically Signed Sanjuana Kava, MD 12/8/20208:52 AM

## 2019-09-11 ENCOUNTER — Other Ambulatory Visit: Payer: Self-pay

## 2019-09-11 ENCOUNTER — Ambulatory Visit (INDEPENDENT_AMBULATORY_CARE_PROVIDER_SITE_OTHER): Payer: Medicare HMO | Admitting: Orthopedic Surgery

## 2019-09-11 VITALS — BP 130/89 | HR 86 | Temp 96.9°F | Ht 63.0 in | Wt 173.0 lb

## 2019-09-11 DIAGNOSIS — M23321 Other meniscus derangements, posterior horn of medial meniscus, right knee: Secondary | ICD-10-CM

## 2019-09-11 DIAGNOSIS — M1711 Unilateral primary osteoarthritis, right knee: Secondary | ICD-10-CM | POA: Diagnosis not present

## 2019-09-11 DIAGNOSIS — M1710 Unilateral primary osteoarthritis, unspecified knee: Secondary | ICD-10-CM

## 2019-09-11 NOTE — Progress Notes (Signed)
Chief Complaint  Patient presents with  . Knee Pain    Right knee pain, referred by Dr. Luna Glasgow.    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  However, she has become completely asymptomatic.  She has adjusted her lifestyle and her activities and is now asymptomatic.  We spent approximately 10 minutes discussing the possible treatment options and pathology in both agree that surgery is not indicated at this time  Greater than 50% of the time was spent on counseling and surgical prep and alternative methods of treatment

## 2019-10-24 DIAGNOSIS — D696 Thrombocytopenia, unspecified: Secondary | ICD-10-CM | POA: Diagnosis not present

## 2019-10-24 DIAGNOSIS — E1169 Type 2 diabetes mellitus with other specified complication: Secondary | ICD-10-CM | POA: Diagnosis not present

## 2019-10-24 DIAGNOSIS — M8580 Other specified disorders of bone density and structure, unspecified site: Secondary | ICD-10-CM | POA: Diagnosis not present

## 2019-10-24 DIAGNOSIS — M5440 Lumbago with sciatica, unspecified side: Secondary | ICD-10-CM | POA: Diagnosis not present

## 2019-10-24 DIAGNOSIS — E7849 Other hyperlipidemia: Secondary | ICD-10-CM | POA: Diagnosis not present

## 2019-10-24 DIAGNOSIS — E6609 Other obesity due to excess calories: Secondary | ICD-10-CM | POA: Diagnosis not present

## 2019-10-24 DIAGNOSIS — E871 Hypo-osmolality and hyponatremia: Secondary | ICD-10-CM | POA: Diagnosis not present

## 2019-10-24 DIAGNOSIS — M1990 Unspecified osteoarthritis, unspecified site: Secondary | ICD-10-CM | POA: Diagnosis not present

## 2019-10-24 DIAGNOSIS — I10 Essential (primary) hypertension: Secondary | ICD-10-CM | POA: Diagnosis not present

## 2019-10-24 DIAGNOSIS — R945 Abnormal results of liver function studies: Secondary | ICD-10-CM | POA: Diagnosis not present

## 2019-11-28 ENCOUNTER — Other Ambulatory Visit: Payer: Self-pay | Admitting: Cardiology

## 2020-03-10 IMAGING — DX DG KNEE AP/LAT W/ SUNRISE*R*
4 series · 4 of 4 positions shown · non-contrast
Comparison: None.

CLINICAL DATA: Acute right knee pain.

EXAM:
RIGHT KNEE 3 VIEWS

[knee ap]
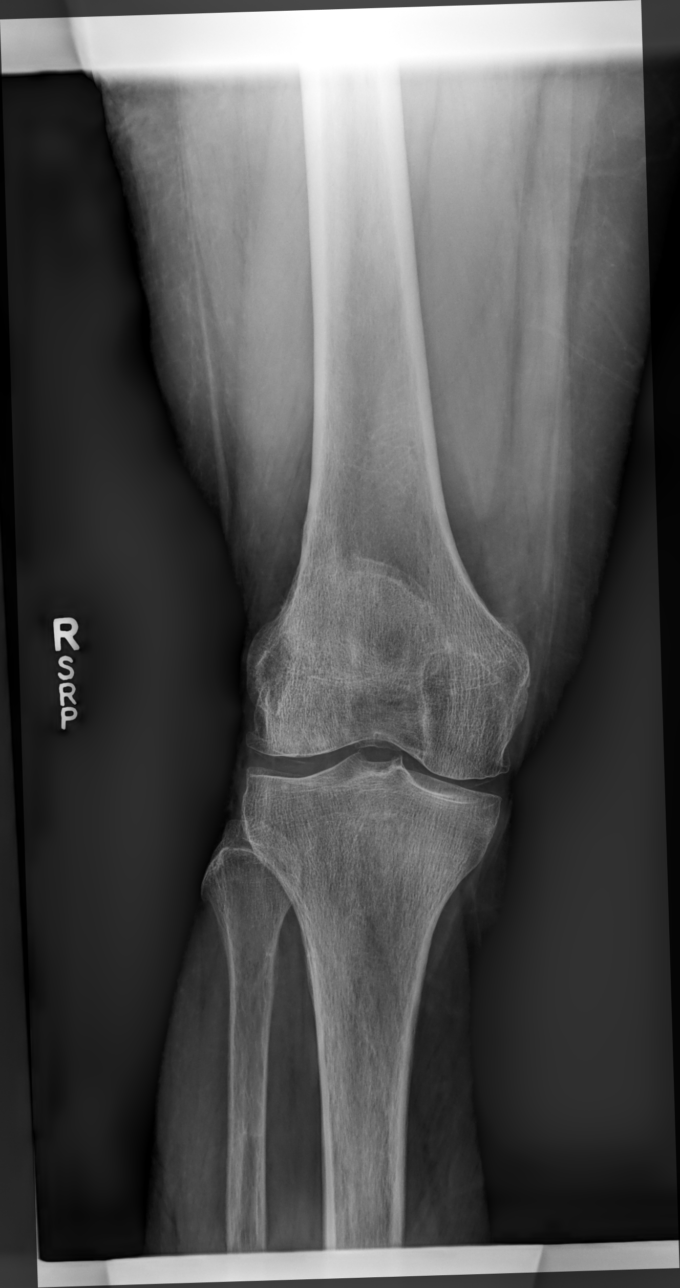

[knee lat]
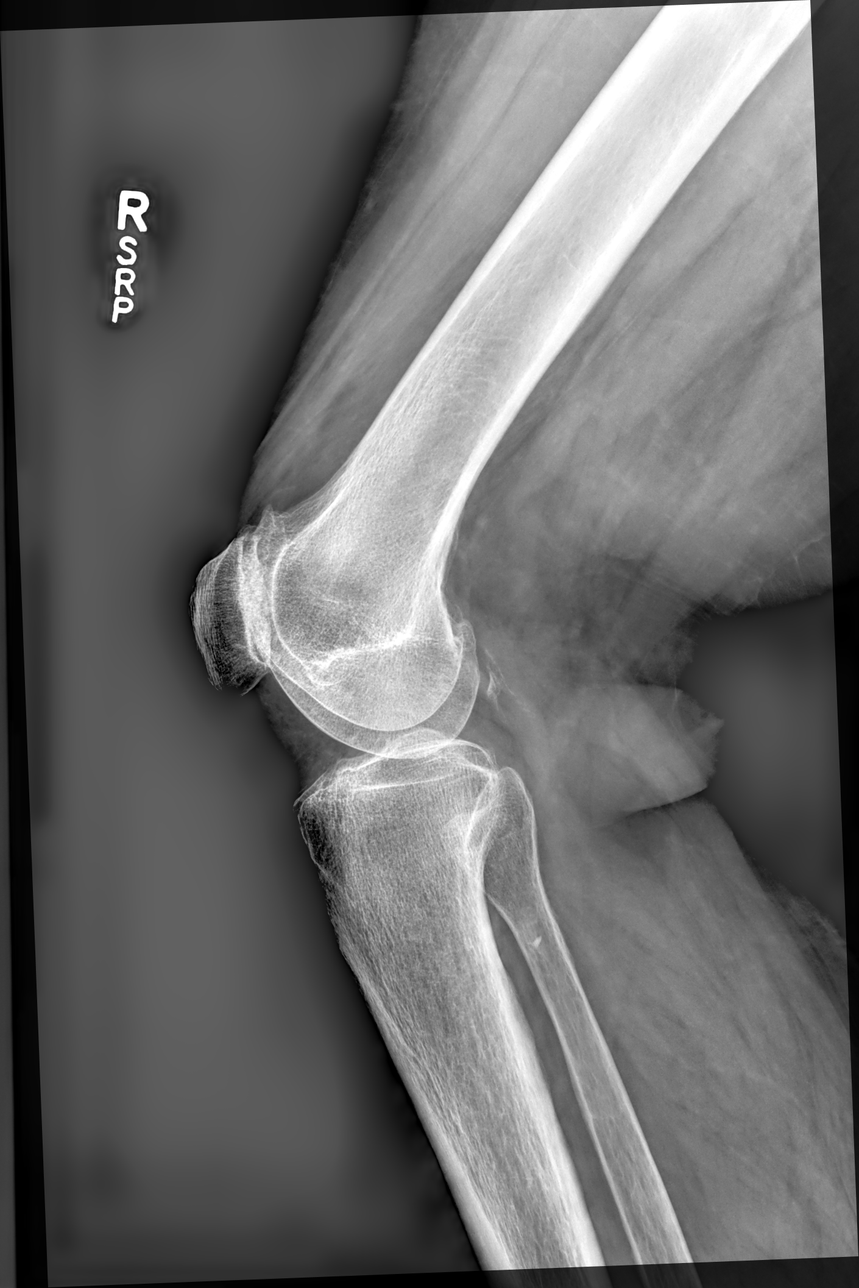

[patella axial tan]
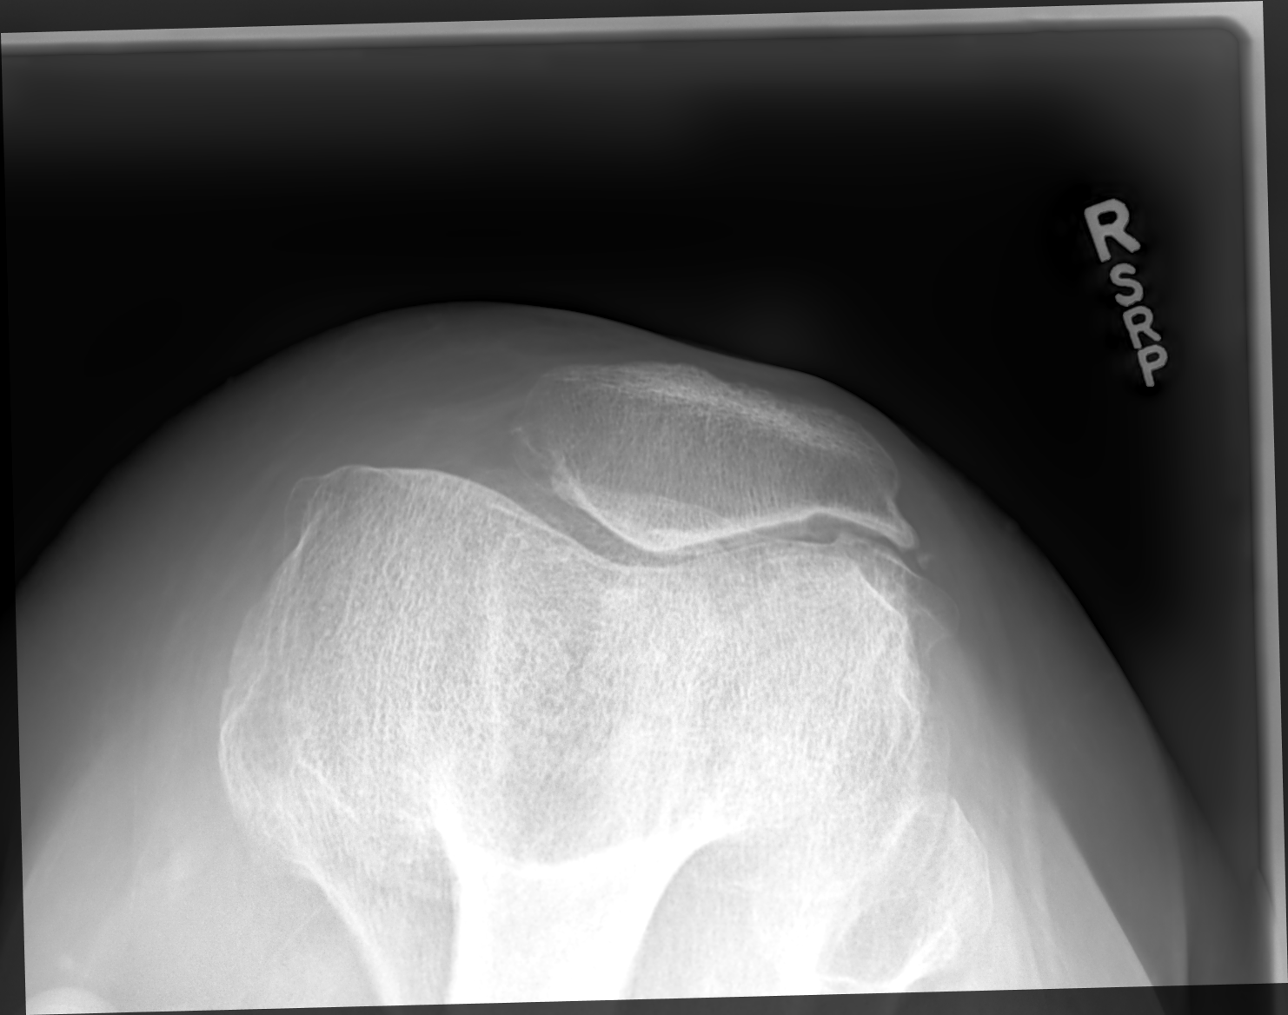

[knee (tunnel view) pa]
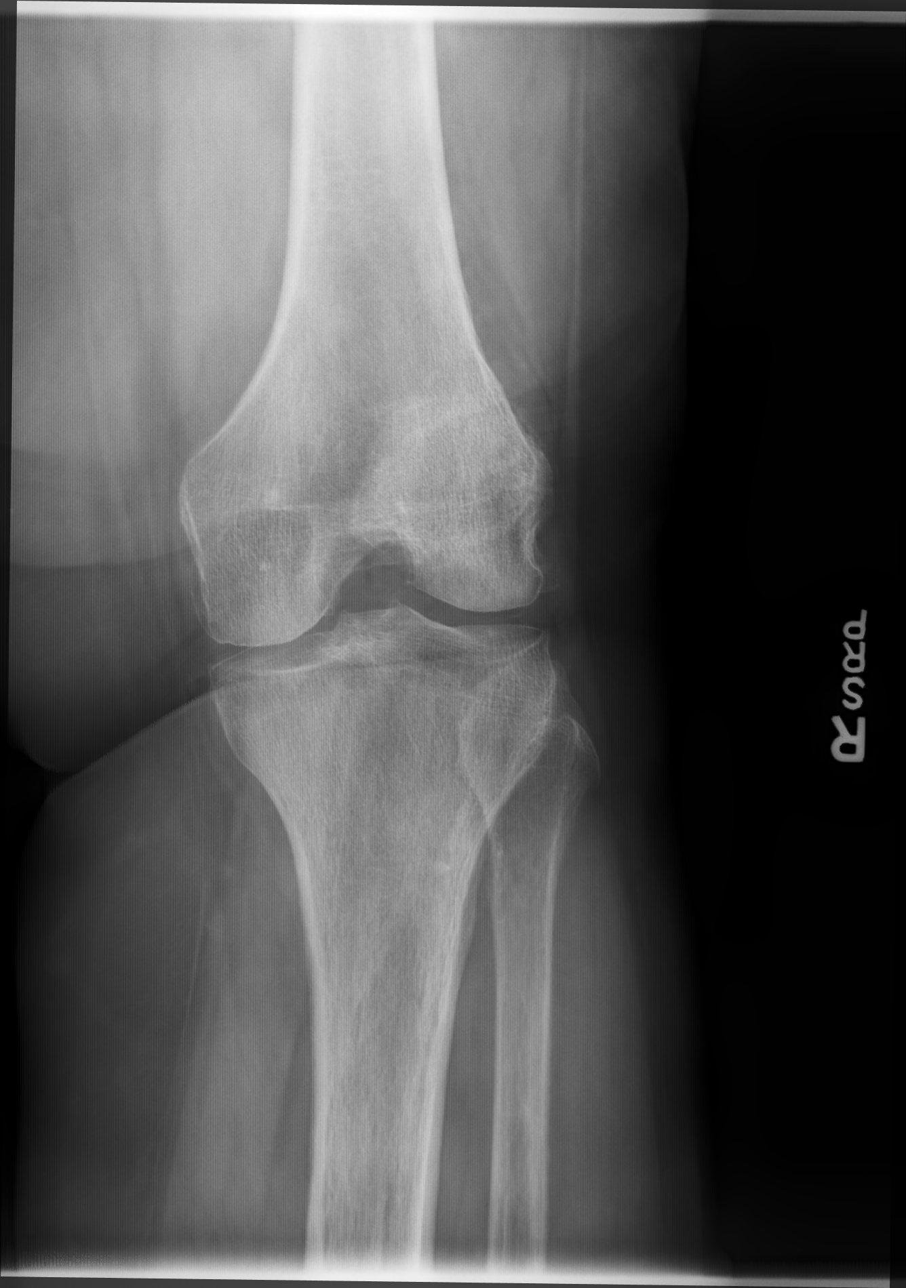

[4 of 4 positions shown; findings below may reference images not displayed]

FINDINGS: No evidence of fracture, dislocation, or joint effusion. Medial and
lateral joint spaces are unremarkable. Moderate narrowing of
patellofemoral space is noted. Soft tissues are unremarkable.
IMPRESSION: Moderate degenerative joint disease is seen involving patellofemoral
space. No acute abnormality is noted in the right knee.
